# Patient Record
Sex: Male | Born: 1965
Health system: Southern US, Community
[De-identification: ages and names within clinical notes are randomized; demographics above are authoritative.]

## PROBLEM LIST (undated history)

## (undated) DIAGNOSIS — N289 Disorder of kidney and ureter, unspecified: Secondary | ICD-10-CM

## (undated) HISTORY — PX: KIDNEY STONE SURGERY: SHX686

---

## 2009-12-03 ENCOUNTER — Ambulatory Visit: Payer: Self-pay | Admitting: Family Medicine

## 2009-12-03 DIAGNOSIS — R079 Chest pain, unspecified: Secondary | ICD-10-CM

## 2009-12-04 ENCOUNTER — Encounter: Payer: Self-pay | Admitting: Family Medicine

## 2009-12-06 LAB — CONVERTED CEMR LAB
Cholesterol: 187 mg/dL (ref 0–200)
HDL: 57 mg/dL (ref >39)
LDL Cholesterol: 121 mg/dL — ABNORMAL HIGH (ref 0–99)

## 2010-10-07 NOTE — Assessment & Plan Note (Signed)
Summary: PHYSICAL   Vital Signs:  Patient Profile:   45 Years Old Male CC:      Physical Height:     71.5 inches Weight:      205 pounds O2 Sat:      100 % O2 treatment:    Room Air Temp:     98.6 degrees F oral Pulse rate:   60 / minute Resp:     16 per minute BP sitting:   120 / 78  (right arm) Cuff size:   regular  Pt. in pain?   no  Vitals Entered By: Lajean Saver RN (December 03, 2009 2:13 PM)               Vision Screening: Left eye w/o correction: 20 / 20 Right Eye w/o correction: 20 / 15 Both eyes w/o correction:  20/ 13  Color vision testing: normal      Vision Entered By: Lajean Saver RN (December 03, 2009 2:22 PM)    Updated Prior Medication List: No Medications Current Allergies: No known allergies History of Present Illness Chief Complaint: Physical History of Present Illness: Subjective:  Patient presents for physical exam.  However he has specific complaint of occasionally recurring left upper chest pain for about 2 months.  The pain is described as very brief and sharp.  It occurs only sporadically, not associated with any activity, and resolves spontaneousl.  No associated nausea/vomiting, sweating, shortness of breath.  It does not occur with acitivty. He feels well otherwise.  He states that he had lipid testing several years ago.  His HDL and LDL were elevated.  REVIEW OF SYSTEMS Constitutional Symptoms      Denies fever, chills, night sweats, weight loss, weight gain, and fatigue.  Eyes       Denies change in vision, eye pain, eye discharge, glasses, contact lenses, and eye surgery. Ear/Nose/Throat/Mouth       Denies hearing loss/aids, change in hearing, ear pain, ear discharge, dizziness, frequent runny nose, frequent nose bleeds, sinus problems, sore throat, hoarseness, and tooth pain or bleeding.  Respiratory       Denies dry cough, productive cough, wheezing, shortness of breath, asthma, bronchitis, and emphysema/COPD.  Cardiovascular     Complains of chest pain.      Denies murmurs and tires easily with exhertion.    Gastrointestinal       Denies stomach pain, nausea/vomiting, diarrhea, constipation, blood in bowel movements, and indigestion. Genitourniary       Denies painful urination, kidney stones, and loss of urinary control. Neurological       Denies paralysis, seizures, and fainting/blackouts. Musculoskeletal       Denies muscle pain, joint pain, joint stiffness, decreased range of motion, redness, swelling, muscle weakness, and gout.  Skin       Denies bruising, unusual mles/lumps or sores, and hair/skin or nail changes.  Psych       Denies mood changes, temper/anger issues, anxiety/stress, speech problems, depression, and sleep problems. Other Comments: Physical. Patient c/o sharp CP intermittently starting 4 months ago   Past History:  Past Medical History: kidney stones  Past Surgical History: Denies surgical history  Family History: Father- MI, triple bypass CABG, aged 18 Paternal grandfather died of MI aged 14+  Social History: Occupation: truck Hospital doctor Married Never Smoked Alcohol use-yes, weekends Drug use-no Regular exercise-no Smoking Status:  never Drug Use:  no Does Patient Exercise:  no Physical Exam General appearance: well developed, well nourished, no acute distress Head:  normocephalic, atraumatic Eyes: conjunctivae and lids normal Pupils: equal, round, reactive to light Ears: normal, no lesions or deformities Nasal: mucosa pink, nonedematous, no septal deviation, turbinates normal Oral/Pharynx: tongue normal, posterior pharynx without erythema or exudate Neck: neck supple,  trachea midline, no masses Thyroid: no nodules, masses, tenderness, or enlargement Chest/Lungs: no rales, wheezes, or rhonchi bilateral, breath sounds equal without effort Heart: regular rate and  rhythm, no murmur Abdomen: soft, non-tender without obvious organomegaly GU: normal Extremities: normal  extremities Neurological: grossly intact and non-focal Back: no tenderness over musculature, straight leg raises negative bilaterally, deep tendon reflexes 2+ at achilles and patella Skin: no obvious rashes or lesions MSE: oriented to time, place, and person Rectal Exam:  Anus is normal without surrounding erythema.   No external hemorrhoids are present.  No lesions are palpated in the rectal vault.    Prostate is slightly enlarged but symmetric without tenderness or nodules.   EKG:  normal except sinus bradycardia Assessment New Problems: FAMILY HISTORY OF ISCHEMIC HEART DISEASE (ICD-V17.3) CHEST PAIN (ICD-786.50)  NORMAL PHYSICAL EXAM AND EKG.  HAS RISK FACTORS FOR CV DISEASE:  FAMILY HISTORY, PAST HX OF ELEVATED LIPIDS  Plan New Orders: EKG w/ Interpretation [93000] T-Chest x-ray, 2 views [71020] TLB-Lipid Panel [80061-LIPID] New Patient Level V [99205] Planning Comments:   Fasting lipid panel.  Recommend screening PSA, CBC, TSH, UA, and CMP.  Patient declines Appt to cardiologist:  consider stress test.   The patient and/or caregiver has been counseled thoroughly with regard to medications prescribed including dosage, schedule, interactions, rationale for use, and possible side effects and they verbalize understanding.  Diagnoses and expected course of recovery discussed and will return if not improved as expected or if the condition worsens. Patient and/or caregiver verbalized understanding.

## 2012-08-12 DIAGNOSIS — N5089 Other specified disorders of the male genital organs: Secondary | ICD-10-CM | POA: Insufficient documentation

## 2015-07-08 ENCOUNTER — Ambulatory Visit (INDEPENDENT_AMBULATORY_CARE_PROVIDER_SITE_OTHER): Payer: BLUE CROSS/BLUE SHIELD

## 2015-07-08 ENCOUNTER — Encounter: Payer: Self-pay | Admitting: Family Medicine

## 2015-07-08 ENCOUNTER — Ambulatory Visit (INDEPENDENT_AMBULATORY_CARE_PROVIDER_SITE_OTHER): Payer: BLUE CROSS/BLUE SHIELD | Admitting: Family Medicine

## 2015-07-08 VITALS — BP 128/76 | HR 76 | Ht 71.0 in | Wt 211.0 lb

## 2015-07-08 DIAGNOSIS — R1031 Right lower quadrant pain: Secondary | ICD-10-CM

## 2015-07-08 DIAGNOSIS — N5089 Other specified disorders of the male genital organs: Secondary | ICD-10-CM

## 2015-07-08 DIAGNOSIS — N509 Disorder of male genital organs, unspecified: Secondary | ICD-10-CM

## 2015-07-08 DIAGNOSIS — R1030 Lower abdominal pain, unspecified: Secondary | ICD-10-CM | POA: Insufficient documentation

## 2015-07-08 DIAGNOSIS — M25551 Pain in right hip: Secondary | ICD-10-CM

## 2015-07-08 LAB — COMPREHENSIVE METABOLIC PANEL
ALK PHOS: 81 U/L (ref 40–115)
ALT: 26 U/L (ref 9–46)
AST: 20 U/L (ref 10–40)
Albumin: 4.3 g/dL (ref 3.6–5.1)
BILIRUBIN TOTAL: 0.6 mg/dL (ref 0.2–1.2)
BUN: 15 mg/dL (ref 7–25)
CHLORIDE: 104 mmol/L (ref 98–110)
CO2: 27 mmol/L (ref 20–31)
Calcium: 9.4 mg/dL (ref 8.6–10.3)
Creat: 0.84 mg/dL (ref 0.60–1.35)
GLUCOSE: 107 mg/dL — AB (ref 65–99)
POTASSIUM: 4.3 mmol/L (ref 3.5–5.3)
Sodium: 141 mmol/L (ref 135–146)
TOTAL PROTEIN: 6.9 g/dL (ref 6.1–8.1)

## 2015-07-08 LAB — CBC
HCT: 41.4 % (ref 39.0–52.0)
Hemoglobin: 14 g/dL (ref 13.0–17.0)
MCH: 28.6 pg (ref 26.0–34.0)
MCHC: 33.8 g/dL (ref 30.0–36.0)
MCV: 84.7 fL (ref 78.0–100.0)
MPV: 10.3 fL (ref 8.6–12.4)
PLATELETS: 298 10*3/uL (ref 150–400)
RBC: 4.89 MIL/uL (ref 4.22–5.81)
RDW: 13.2 % (ref 11.5–15.5)
WBC: 6.6 10*3/uL (ref 4.0–10.5)

## 2015-07-08 LAB — LIPID PANEL
CHOL/HDL RATIO: 3.4 ratio (ref <=5.0)
CHOLESTEROL: 182 mg/dL (ref 125–200)
HDL: 53 mg/dL (ref >=40)
LDL Cholesterol: 116 mg/dL (ref <130)
Triglycerides: 63 mg/dL (ref <150)
VLDL: 13 mg/dL (ref <30)

## 2015-07-08 NOTE — Assessment & Plan Note (Signed)
Unclear etiology. Will obtain x-ray of pelvis to evaluate for hip OA. Additionally obtain urine GC chlamydia amplification. Additionally obtain CBC metabolic panel and lipid panel for standard fasting labs.

## 2015-07-08 NOTE — Progress Notes (Signed)
Trevor Ball is a 49 y.o. male who [facility]  today for establish care and discuss right groin pain.  Patient has had right-sided groin pain intermittently for years. In 2013 this was worked up and evaluated with scrotal ultrasound showing a small noncancerous appearing cyst on the right testicle associated with small bilateral hydroceles and a images suggestive of a right inguinal hernia.  Several years she has done pretty well with minimal pain. He denies any significant swelling or bulging into his groin. He denies any change with urination penile discharge or pain with ejaculation. He notes normal bowel movements. His pain does not seem to be related to hip positioning. He doesn't truck for living. He's here today because a week ago his symptoms worsened and have become more continuous. He would describe his pain is mild and not very bothersome.   History reviewed. No pertinent past medical history. Past Surgical History  Procedure Laterality Date  . Kidney stone surgery     Social History  Substance Use Topics  . Smoking status: Never Smoker   . Smokeless tobacco: Not on file  . Alcohol Use: 0.0 oz/week    0 Standard drinks or equivalent per week   family history includes Heart disease in his father.  ROS as above: No loss night sweats fevers chills nausea vomiting diarrhea chest pain palpitations shortness of breath. Medications: No current outpatient prescriptions on file.   No current facility-administered medications for this visit.   No Known Allergies   Exam:  BP 128/76 mmHg  Pulse 76  Ht 5\' 11"  (1.803 m)  Wt 211 lb (95.709 kg)  BMI 29.44 kg/m2 Gen: Well NAD HEENT: EOMI,  MMM Lungs: Normal work of breathing. CTABL Heart: RRR no MRG Abd: NABS, Soft. Nondistended, Nontender Exts: Brisk capillary refill, warm and well perfused.  MSK: Hips are nontender bilaterally. Normal flexion and extension. Slightly impaired  internal rotation bilaterally without pain. Pelvis: No inguinal lymphadenopathy. Testicles are descended bilaterally and nontender. Cyst is not palpable per my exam on the right testicle. No palpable inguinal hernias present on dedicated exam. Penis is uncircumcised without discharge.  No results found for this or any previous visit (from the past 24 hour(s)). No results found.   Please see individual assessment and plan sections.

## 2015-07-08 NOTE — Assessment & Plan Note (Signed)
Documented on ultrasound from [facility]. Appears benign. Exam today was also benign.

## 2015-07-08 NOTE — Progress Notes (Signed)
Quick Note:  Xray normal. ______ 

## 2015-07-08 NOTE — Patient Instructions (Signed)
Thank you for coming in today. Return in 1 month for wellness physical and recheck blood pressure.  My nurse will call with results.  If your belly pain worsens, or you have high fever, bad vomiting, blood in your stool or black tarry stool go to the Emergency Room.

## 2015-07-09 ENCOUNTER — Encounter: Payer: Self-pay | Admitting: Family Medicine

## 2015-07-09 DIAGNOSIS — R7301 Impaired fasting glucose: Secondary | ICD-10-CM | POA: Insufficient documentation

## 2015-07-09 LAB — GC PROBE AMPLIFICATION, URINE: GC Probe Amp, Urine: NEGATIVE

## 2015-07-10 NOTE — Progress Notes (Signed)
Quick Note:  Labs show mildly elevated sugar. We will continue to follow. Otherwise labs were normal. ______

## 2015-08-12 ENCOUNTER — Other Ambulatory Visit: Payer: Self-pay | Admitting: Family Medicine

## 2015-08-12 ENCOUNTER — Ambulatory Visit: Payer: BLUE CROSS/BLUE SHIELD

## 2015-08-12 ENCOUNTER — Ambulatory Visit (INDEPENDENT_AMBULATORY_CARE_PROVIDER_SITE_OTHER): Payer: BLUE CROSS/BLUE SHIELD | Admitting: Family Medicine

## 2015-08-12 ENCOUNTER — Encounter: Payer: Self-pay | Admitting: Family Medicine

## 2015-08-12 ENCOUNTER — Ambulatory Visit (INDEPENDENT_AMBULATORY_CARE_PROVIDER_SITE_OTHER): Payer: BLUE CROSS/BLUE SHIELD

## 2015-08-12 VITALS — BP 142/76 | HR 83 | Wt 213.0 lb

## 2015-08-12 DIAGNOSIS — R1031 Right lower quadrant pain: Secondary | ICD-10-CM

## 2015-08-12 DIAGNOSIS — N5089 Other specified disorders of the male genital organs: Secondary | ICD-10-CM

## 2015-08-12 DIAGNOSIS — N509 Disorder of male genital organs, unspecified: Secondary | ICD-10-CM | POA: Diagnosis not present

## 2015-08-12 DIAGNOSIS — N503 Cyst of epididymis: Secondary | ICD-10-CM

## 2015-08-12 DIAGNOSIS — N432 Other hydrocele: Secondary | ICD-10-CM

## 2015-08-12 DIAGNOSIS — N50811 Right testicular pain: Secondary | ICD-10-CM

## 2015-08-12 NOTE — Progress Notes (Signed)
Trevor Ball is a 49 y.o. male who presents to [facility]: [facility]  today for follow-up right groin pain. Patient continues to have intermittent right groin pain off and on. He had a normal pelvis x-ray recently. He has an old ultrasound of the scrotum that shows an epidermoid cyst as well as a small right-sided inguinal hernia. He notes symptoms continue and are quite bothersome at times. He denies any scrotum mass that's new or trouble urinating or having a bowel movement.   No past medical history on file. Past Surgical History  Procedure Laterality Date  . Kidney stone surgery     Social History  Substance Use Topics  . Smoking status: Never Smoker   . Smokeless tobacco: Not on file  . Alcohol Use: 0.0 oz/week    0 Standard drinks or equivalent per week   family history includes Heart disease in his father.  ROS as above Medications: No current outpatient prescriptions on file.   No current facility-administered medications for this visit.   No Known Allergies   Exam:  BP 142/76 mmHg  Pulse 83  Wt 213 lb (96.616 kg) Gen: Well NAD   No results found for this or any previous visit (from the past 24 hour(s)). No results found.   Please see individual assessment and plan sections.

## 2015-08-12 NOTE — Patient Instructions (Signed)
Thank you for coming in today. Go to radiology to schedule the ultrasound today.  We will call with results.

## 2015-08-12 NOTE — Assessment & Plan Note (Signed)
Scrotum ultrasound ordered. If hernia present refer to general surgery

## 2015-08-13 NOTE — Progress Notes (Signed)
Quick Note:  No hernia is present. There are some changes in the scrotum that may be causing the pain. I will refer you to urology for their opinion about the cause of the pain and for any possible solution. ______

## 2016-06-29 ENCOUNTER — Encounter: Payer: Self-pay | Admitting: Family Medicine

## 2016-06-29 ENCOUNTER — Ambulatory Visit (INDEPENDENT_AMBULATORY_CARE_PROVIDER_SITE_OTHER): Payer: BLUE CROSS/BLUE SHIELD | Admitting: Family Medicine

## 2016-06-29 VITALS — BP 127/82 | HR 82 | Wt 212.0 lb

## 2016-06-29 DIAGNOSIS — L219 Seborrheic dermatitis, unspecified: Secondary | ICD-10-CM | POA: Insufficient documentation

## 2016-06-29 DIAGNOSIS — Z23 Encounter for immunization: Secondary | ICD-10-CM | POA: Diagnosis not present

## 2016-06-29 DIAGNOSIS — R7301 Impaired fasting glucose: Secondary | ICD-10-CM | POA: Diagnosis not present

## 2016-06-29 DIAGNOSIS — L989 Disorder of the skin and subcutaneous tissue, unspecified: Secondary | ICD-10-CM

## 2016-06-29 DIAGNOSIS — Z1211 Encounter for screening for malignant neoplasm of colon: Secondary | ICD-10-CM

## 2016-06-29 MED ORDER — KETOCONAZOLE 2 % EX CREA: 1.0000 "application " | TOPICAL_CREAM | Freq: Two times a day (BID) | CUTANEOUS | 3 refills | Status: DC

## 2016-06-29 NOTE — Patient Instructions (Signed)
Thank you for coming in today. Get fasting labs soon.  Use ketocaonazole cream as needed.   Seborrheic Dermatitis Seborrheic dermatitis involves pink or red skin with greasy, flaky scales. It usually occurs on the scalp, and it is often called dandruff. This condition may also affect the eyebrows, nose, ears, chest, and the bearded area of men's faces. It often occurs where skin has more oil (sebaceous) glands. It may come and go for no known reason, and it is often long-lasting (chronic). CAUSES The cause is not known. RISK FACTORS This condition is more like to develop in:  People who are stressed or tired.  People who have skin conditions, such as acne.  People who have certain conditions, such as:  HIV (human immunodeficiency virus).  AIDS (acquired immunodeficiency syndrome).  Parkinson disease.  An eating disorder.  Stroke.  Depression.  Epilepsy.  Alcoholism.  People who live in places that have extreme weather.  People who have a family history of seborrheic dermatitis.  People who use skin creams that are made with alcohol.  People who are 7830-50 years old.  People who take certain medicines. SYMPTOMS Symptoms of this condition include:  Thick scales on the scalp.  Redness on the face or in the armpits.  Skin that is flaky. The flakes may be white or yellow.  Skin that seems oily or dry but is not helped with moisturizers.  Itching or burning in the affected areas. DIAGNOSIS This condition is diagnosed with a medical history and physical exam. A sample of your skin may be tested (skin biopsy). You may need to see a skin specialist (dermatologist). TREATMENT There is no cure for this condition, but treatment can help to manage the symptoms. Treatment may include:  Cortisone (steroid) ointments, creams, and lotions.  Over-the-counter or prescription shampoos. HOME CARE INSTRUCTIONS  Apply over-the-counter and prescription medicines only as told by  your health care provider.  Keep all follow-up visits as told by your health care provider. This is important.  Try to reduce your stress, such as with yoga or mediation. If you need help to reduce stress, ask your health care provider.  Shower or bathe as told by your health care provider.  Use any medicated shampoos as told by your health care provider. SEEK MEDICAL CARE IF:  Your symptoms do not improve with treatment.  Your symptoms get worse.  You have new symptoms.   This information is not intended to replace advice given to you by your health care provider. Make sure you discuss any questions you have with your health care provider.   Document Released: 08/24/2005 Document Revised: 05/15/2015 Document Reviewed: 01/09/2015 Elsevier Interactive Patient Education [employer]2016 Elsevier Inc.

## 2016-06-29 NOTE — Progress Notes (Signed)
       Trevor GrahamGeorge Ball is a 50 y.o. male who [facility] today for skin lesion on back, seborrheic dermatitis, follow-up impaired fasting glucose, colon cancer screening.  Skin lesion on back: Patient has a history of multiple nevi on his trunk. He notes one on the right lower back has been changing over the past 2 months. It has become more prominent and has developed a small papule. He denies any fevers or chills weight loss or night sweats.  Additionally patient has a history of seborrheic dermatitis in his eyebrows. In the past he's been prescribed a cream and would like a refill of some cream for his face rash.  Impaired fasting glucose noted on previous fasting labs. He would like this issue followed up today as well.  Health maintenance: Patient declines influenza vaccine but will receive Tdap today. Additionally he would like Cologuard for colon cancer screening.   No past medical history on file. Past Surgical History:  Procedure Laterality Date  . KIDNEY STONE SURGERY     Social History  Substance Use Topics  . Smoking status: Never Smoker  . Smokeless tobacco: Not on file  . Alcohol use 0.0 oz/week   family history includes Heart disease in his father.  ROS as above:  Medications: Current Outpatient Prescriptions  Medication Sig Dispense Refill  . ketoconazole (NIZORAL) 2 % cream Apply 1 application topically 2 (two) times daily. To affected areas. 60 g 3   No current facility-administered medications for this visit.    No Known Allergies  Health Maintenance Health Maintenance  Topic Date Due  . TETANUS/TDAP  02/24/1985  . COLONOSCOPY  02/25/2016  . INFLUENZA VACCINE  05/08/2029 (Originally 04/07/2016)  . HIV Screening  Addressed     Exam:  BP 127/82   Pulse 82   Wt 212 lb (96.2 kg)   BMI 29.57 kg/m  Gen: Well NAD HEENT: EOMI,   MMM Lungs: Normal work of breathing. CTABL Heart: RRR no MRG Abd: NABS, Soft. Nondistended, Nontender Exts: Brisk capillary refill, warm and well perfused.  Skin: Back multiple flat nevi none larger than 1 cm. Right low back nevus is 0.7cm oval circular with a  Small papule. Non-tender.  Face: Erythematous scaly rash around eyebrows.  Shave biopsy: Consent obtained and timeout performed. Skin cleaned with alcohol and cold spray applied and 1 mL of lidocaine injected achieving good anesthesia. Skin again cleaned with alcohol and shave biopsy obtained. Dressing applied. Patient tolerated the procedure well.    No results found for this or any previous visit (from the past 72 hour(s)). No results found.    Assessment and Plan: 50 y.o. male with   Nevus on back somewhat dysplastic appearing. Shave biopsy obtained and pending.  Seborrheic dermatitis treated with ketoconazole cream.  Hyperglycemia: Labs below pending.  Health maintenance: Cologuard and Tdap ordered. Influenza vaccine declined.   Orders Placed This Encounter  Procedures  . Tdap vaccine greater than or equal to 7yo IM  . CBC  . COMPLETE METABOLIC PANEL WITH GFR  . Hemoglobin A1c  . Lipid panel  . TSH  . VITAMIN D 25 Hydroxy (Vit-D Deficiency, Fractures)    Discussed warning signs or symptoms. Please see discharge instructions. Patient expresses understanding.

## 2017-06-28 ENCOUNTER — Encounter: Payer: Self-pay | Admitting: Family Medicine

## 2017-06-28 ENCOUNTER — Ambulatory Visit (INDEPENDENT_AMBULATORY_CARE_PROVIDER_SITE_OTHER): Payer: BLUE CROSS/BLUE SHIELD

## 2017-06-28 ENCOUNTER — Ambulatory Visit (INDEPENDENT_AMBULATORY_CARE_PROVIDER_SITE_OTHER): Payer: BLUE CROSS/BLUE SHIELD | Admitting: Family Medicine

## 2017-06-28 VITALS — BP 133/77 | HR 69 | Ht 71.0 in | Wt 218.0 lb

## 2017-06-28 DIAGNOSIS — L219 Seborrheic dermatitis, unspecified: Secondary | ICD-10-CM | POA: Diagnosis not present

## 2017-06-28 DIAGNOSIS — R7301 Impaired fasting glucose: Secondary | ICD-10-CM

## 2017-06-28 DIAGNOSIS — Z0000 Encounter for general adult medical examination without abnormal findings: Secondary | ICD-10-CM

## 2017-06-28 DIAGNOSIS — H01002 Unspecified blepharitis right lower eyelid: Secondary | ICD-10-CM | POA: Diagnosis not present

## 2017-06-28 DIAGNOSIS — R05 Cough: Secondary | ICD-10-CM

## 2017-06-28 DIAGNOSIS — R079 Chest pain, unspecified: Secondary | ICD-10-CM

## 2017-06-28 DIAGNOSIS — Z1211 Encounter for screening for malignant neoplasm of colon: Secondary | ICD-10-CM

## 2017-06-28 MED ORDER — POLYMYXIN B-TRIMETHOPRIM 10000-0.1 UNIT/ML-% OP SOLN: 2.0000 [drp] | OPHTHALMIC | 0 refills | Status: DC

## 2017-06-28 NOTE — Progress Notes (Signed)
Trevor Ball is a 51 y.o. male who presents to [name] today for well adult visit.   Trevor Ball has two issues today he would like to discuss.  Chest Pain: Trevor Ball notes occasional left-sided chest pain. This typically do not occur with exertion. He denies significant palpitations or shortness of breath. He feels well otherwise. He notes a positive family history for early cardiac disease in both his father and grandfather.  Additionally he has some irritation of the right lower eyelid. This was bad about a week ago and is improving now with no treatment. He denies any blurry vision or pain. No fevers or chills. No significant discharge.  Overall he thinks his health is pretty good. He does not smoke or drink excessively. He gets moderate exercise and try Eat a careful diet.   No past medical history on file. Past Surgical History:  Procedure Laterality Date  . KIDNEY STONE SURGERY     Social History  Substance Use Topics  . Smoking status: Never Smoker  . Smokeless tobacco: Never Used  . Alcohol use 0.0 oz/week   family history includes Heart disease in his father.  ROS as above:  Medications: Current Outpatient Prescriptions  Medication Sig Dispense Refill  . ketoconazole (NIZORAL) 2 % cream Apply 1 application topically 2 (two) times daily. To affected areas. (Patient not taking: Reported on 06/28/2017) 60 g 3  . trimethoprim-polymyxin b (POLYTRIM) ophthalmic solution Place 2 drops into the right eye every 4 (four) hours. 10 mL 0   No current facility-administered medications for this visit.    No Known Allergies  Health Maintenance Health Maintenance  Topic Date Due  . COLONOSCOPY  02/25/2016  . INFLUENZA VACCINE  06/28/2018 (Originally 04/07/2017)  . TETANUS/TDAP  06/29/2026  . HIV Screening  Completed     Exam:  BP 133/77   Pulse 69   Ht 5'  11" (1.803 m)   Wt 218 lb (98.9 kg)   BMI 30.40 kg/m  Gen: Well NAD HEENT: EOMI,  MMM Mild injection and erythema of the right inferior lateral eyelid. Mild conjunctival injection. Lungs: Normal work of breathing. CTABL Heart: RRR no MRG Abd: NABS, Soft. Nondistended, Nontender Exts: Brisk capillary refill, warm and well perfused.    12 lead EKG: NSR at 71 bpm. No qwaves, no ST elevation or depression. Normal EKG.   No results found for this or any previous visit (from the past 72 hour(s)). Dg Chest 2 View  Result Date: 06/28/2017 CLINICAL DATA:  Left chest pain and cough for 1 month. EXAM: CHEST  2 VIEW COMPARISON:  PA and lateral chest 12/03/2009. FINDINGS: The lungs are clear. Heart size is normal. No pneumothorax or pleural effusion. No acute bony abnormality. IMPRESSION: No acute disease. Electronically Signed   By: Drusilla Kanner M.D.   On: 06/28/2017 09:45      Assessment and Plan: 51 y.o. male with \ Well adult:  Doing reasonably well Overall. Plan to check basic fasting labs listed below.    Chest pain: Likely noncardiac as EKG and chest x-ray normal. Regardless his risk is somewhat elevated based on his family history. Plan to obtain an stress test and lipid panel to risk factor categorize him better.  Blepharitis right lower eyelid: Doing well. Plan for warm compress as well as Polytrim antibiotic drops  Orders Placed This Encounter  Procedures  . DG Chest 2 View    Order Specific Question:  Reason for exam:    Answer:   Cough, assess intra-thoracic pathology    Order Specific Question:   Preferred imaging location?    Answer[facility]   [facility]  . CBC  . COMPLETE METABOLIC PANEL WITH GFR  . Lipid Panel w/reflex Direct LDL  . Hemoglobin A1c  . Ambulatory referral to Gastroenterology    Referral Priority:   Routine    Referral Type:   Consultation    Referral Reason:   Specialty Services Required    Number of Visits Requested:   1  . Exercise  Tolerance Test    Standing Status:   Future    Standing Expiration Date:   09/28/2018    Order Specific Question:   Where should this test be performed    Answer:   [facility] [facility]    Order Specific Question:   Stress with Dobutamine or Treadmill with exercise?    Answer:   Treadmill w/ exercise    Order Specific Question:   Is patient able to ambulate on a treadmill?    Answer:   Yes  . EKG 12-Lead   Meds ordered this encounter  Medications  . trimethoprim-polymyxin b (POLYTRIM) ophthalmic solution    Sig: Place 2 drops into the right eye every 4 (four) hours.    Dispense:  10 mL    Refill:  0     Discussed warning signs or symptoms. Please see discharge instructions. Patient expresses understanding.

## 2017-06-28 NOTE — Patient Instructions (Signed)
Thank you for coming in today. Continue warm compress for right eye.  You should hear about Treadmill Stress Test.  Let me know if you do not hear anything.  Also you should hear from [facility] about coloscopy.   Get labs today.   Please get an xray of your lungs today.    Recheck yearly or sooner if needed   Blepharitis Blepharitis means swollen eyelids. Follow these instructions at home: Pay attention to any changes in how you look or feel. Follow these instructions to help with your condition: Keeping Clean  Wash your hands often.  Wash your eyelids with warm water, or wash them with warm water that is mixed with little bit of baby shampoo. Do this 2 or more times per day.  Wash your face and eyebrows at least once a day.  Use a clean towel each time you dry your eyelids. Do not use the towel to clean or dry other areas of your body. Do not share your towel with anyone. General instructions  Avoid wearing makeup until you get better. Do not share makeup with anyone.  Avoid rubbing your eyes.  Put a warm compress on your eyes 2 times per day for 10 minutes at a time or as told by your doctor.  If you were told to use an medicated cream or eye drops, use the medicine as told by your doctor. Do not stop using the medicine even if you feel better.  Keep all follow-up visits as told by your doctor. This is important. Contact a doctor if:  Your eyelids feel hot.  You have blisters on your eyelids.  You have a rash on your eyelids.  The swelling does not go away in 2-4 days.  The swelling gets worse. Get help right away if:  You have pain that gets worse.  You have pain that spreads to other parts of your face.  You have redness that gets worse.  You have redness that spreads to other parts of your face.  Your vision changes.  You have pain when you look at lights or things that move.  You have a fever. This information is not intended to replace  advice given to you by your health care provider. Make sure you discuss any questions you have with your health care provider. Document Released: 06/02/2008 Document Revised: 01/30/2016 Document Reviewed: 12/17/2014 Elsevier Interactive Patient Education  [employer]2018 Elsevier Inc. .

## 2017-06-29 LAB — CBC
HEMATOCRIT: 43.2 % (ref 38.5–50.0)
Hemoglobin: 14.5 g/dL (ref 13.2–17.1)
MCH: 27.4 pg (ref 27.0–33.0)
MCHC: 33.6 g/dL (ref 32.0–36.0)
MCV: 81.5 fL (ref 80.0–100.0)
MPV: 10.7 fL (ref 7.5–12.5)
PLATELETS: 266 10*3/uL (ref 140–400)
RBC: 5.3 10*6/uL (ref 4.20–5.80)
RDW: 12.9 % (ref 11.0–15.0)
WBC: 6.3 10*3/uL (ref 3.8–10.8)

## 2017-06-29 LAB — COMPLETE METABOLIC PANEL WITH GFR
AG RATIO: 1.6 (calc) (ref 1.0–2.5)
ALKALINE PHOSPHATASE (APISO): 94 U/L (ref 40–115)
ALT: 23 U/L (ref 9–46)
AST: 22 U/L (ref 10–35)
Albumin: 4.4 g/dL (ref 3.6–5.1)
BILIRUBIN TOTAL: 0.6 mg/dL (ref 0.2–1.2)
BUN: 11 mg/dL (ref 7–25)
CHLORIDE: 103 mmol/L (ref 98–110)
CO2: 31 mmol/L (ref 20–32)
Calcium: 9.6 mg/dL (ref 8.6–10.3)
Creat: 0.92 mg/dL (ref 0.70–1.33)
GFR, Est African American: 111 mL/min/{1.73_m2} (ref >=60)
GFR, Est Non African American: 96 mL/min/{1.73_m2} (ref >=60)
Globulin: 2.7 g/dL (calc) (ref 1.9–3.7)
Glucose, Bld: 99 mg/dL (ref 65–99)
POTASSIUM: 4.7 mmol/L (ref 3.5–5.3)
SODIUM: 139 mmol/L (ref 135–146)
Total Protein: 7.1 g/dL (ref 6.1–8.1)

## 2017-06-29 LAB — LIPID PANEL W/REFLEX DIRECT LDL
CHOLESTEROL: 164 mg/dL (ref <200)
HDL: 49 mg/dL (ref >40)
LDL Cholesterol (Calc): 100 mg/dL (calc) — ABNORMAL HIGH
NON-HDL CHOLESTEROL (CALC): 115 mg/dL (ref <130)
Total CHOL/HDL Ratio: 3.3 (calc) (ref <5.0)
Triglycerides: 63 mg/dL (ref <150)

## 2017-06-29 LAB — HEMOGLOBIN A1C
HEMOGLOBIN A1C: 5 %{Hb} (ref <5.7)
MEAN PLASMA GLUCOSE: 97 (calc)
eAG (mmol/L): 5.4 (calc)

## 2017-07-07 ENCOUNTER — Ambulatory Visit (HOSPITAL_COMMUNITY): Payer: BLUE CROSS/BLUE SHIELD

## 2017-07-19 DIAGNOSIS — D123 Benign neoplasm of transverse colon: Secondary | ICD-10-CM | POA: Diagnosis not present

## 2017-07-19 DIAGNOSIS — Z1211 Encounter for screening for malignant neoplasm of colon: Secondary | ICD-10-CM | POA: Diagnosis not present

## 2017-07-19 LAB — HM COLONOSCOPY

## 2017-08-23 ENCOUNTER — Encounter: Payer: Self-pay | Admitting: *Deleted

## 2019-05-18 IMAGING — DX DG CHEST 2V
2 series · 2 of 2 positions shown · non-contrast
Comparison: PA and lateral chest 12/03/2009.

CLINICAL DATA: Left chest pain and cough for 1 month.

EXAM:
CHEST  2 VIEW

[chest pa]
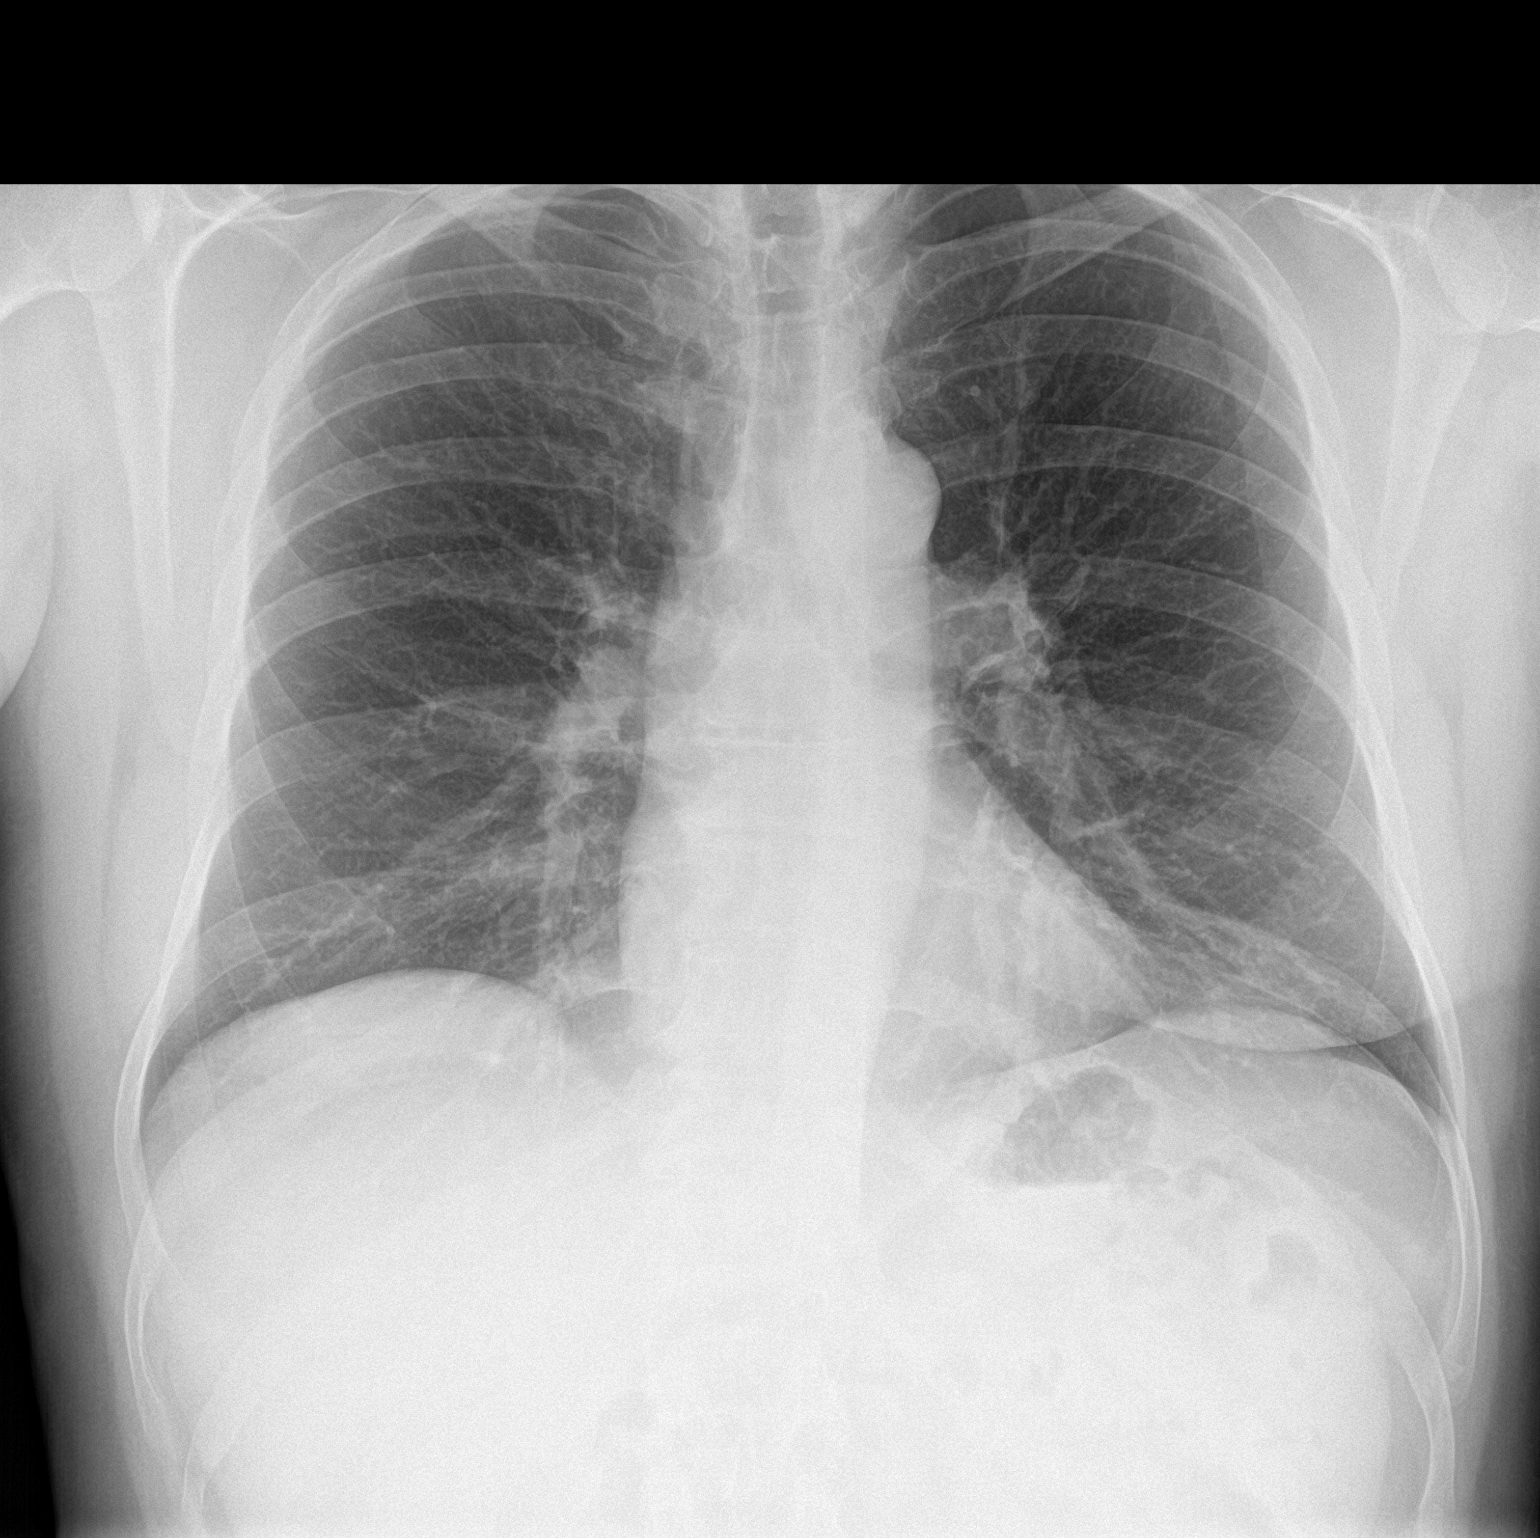

[chest lat]
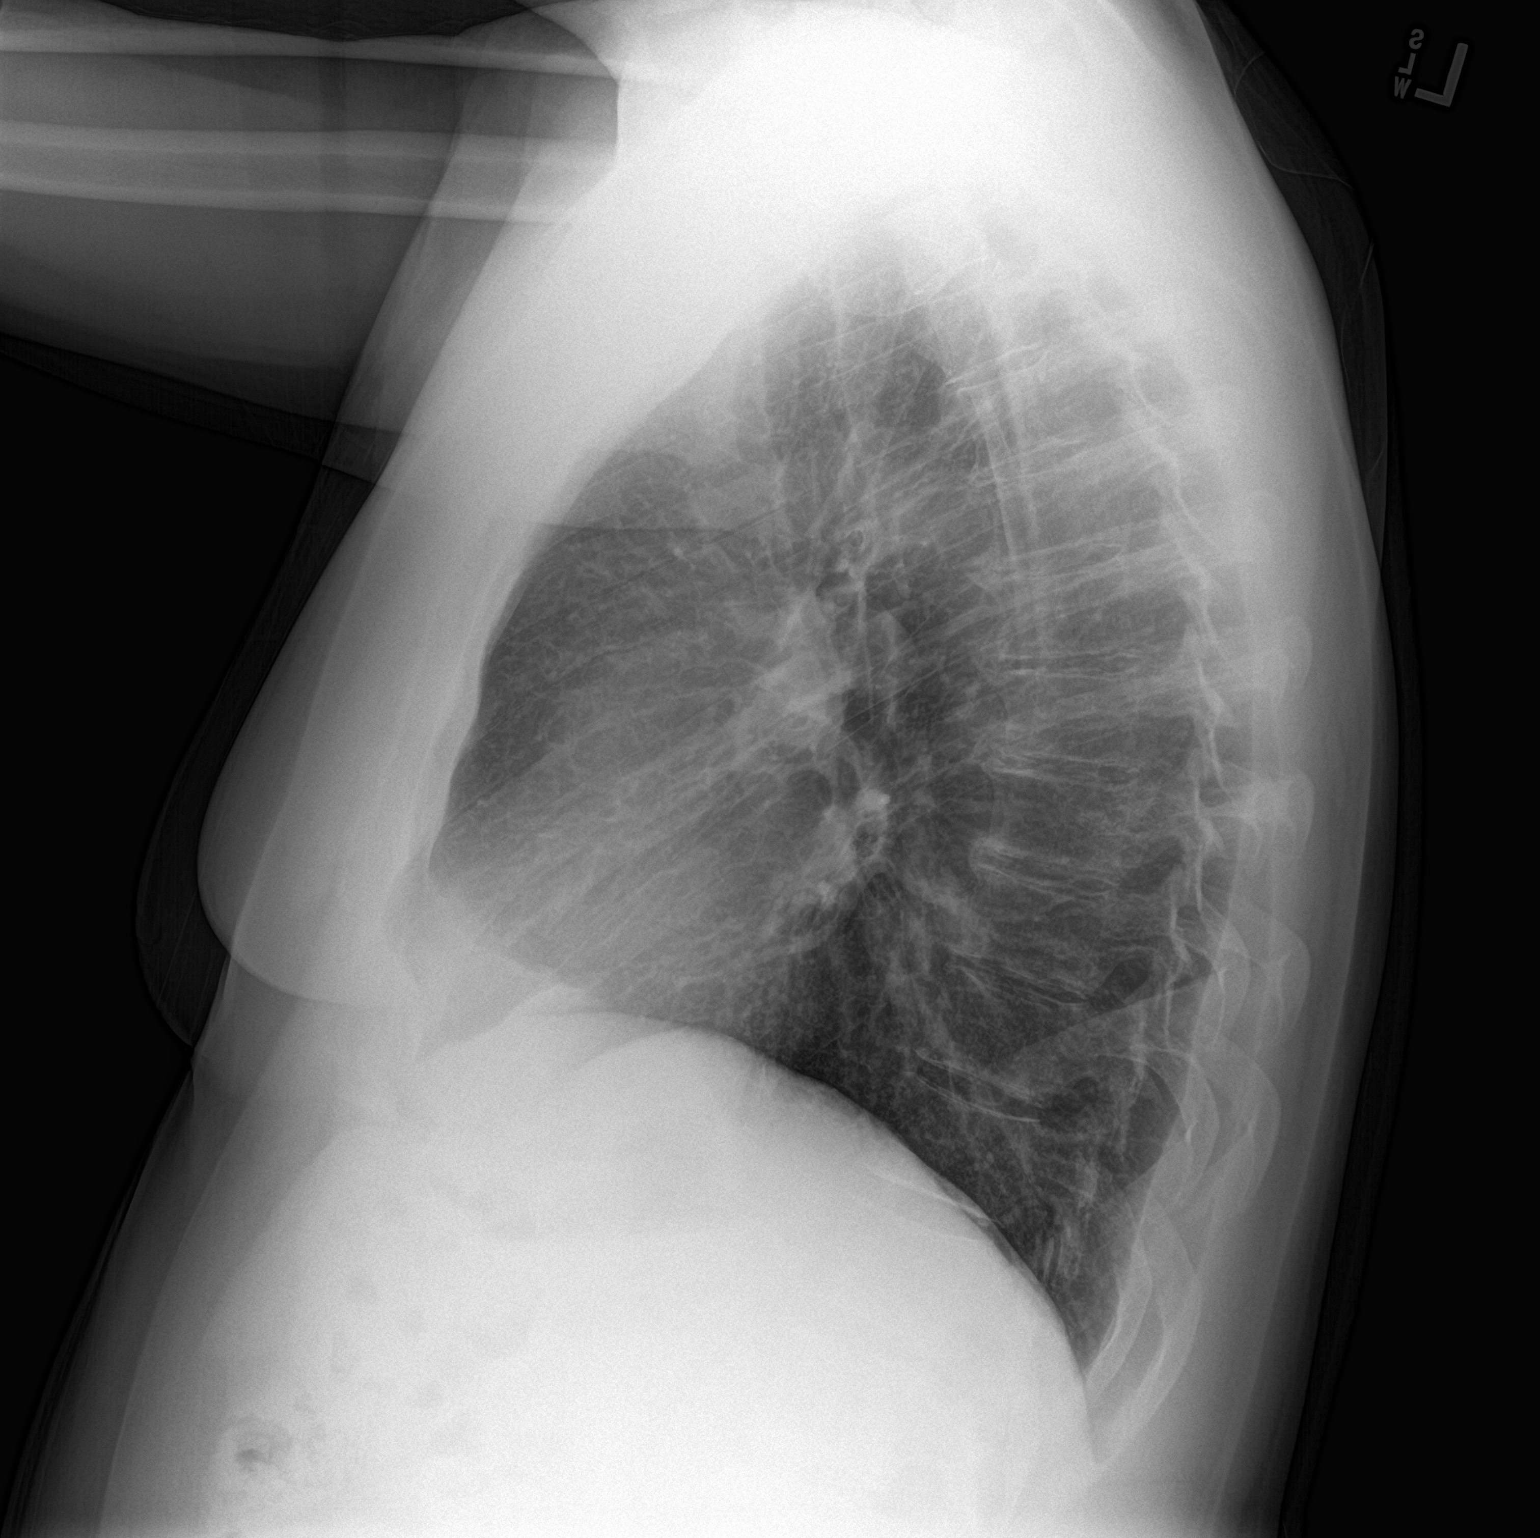

[2 of 2 positions shown; findings below may reference images not displayed]

FINDINGS: The lungs are clear. Heart size is normal. No pneumothorax or
pleural effusion. No acute bony abnormality.
IMPRESSION: No acute disease.

## 2019-09-12 DIAGNOSIS — Z20822 Contact with and (suspected) exposure to covid-19: Secondary | ICD-10-CM | POA: Diagnosis not present

## 2019-11-27 ENCOUNTER — Ambulatory Visit (INDEPENDENT_AMBULATORY_CARE_PROVIDER_SITE_OTHER): Payer: BC Managed Care – PPO | Admitting: Family Medicine

## 2019-11-27 ENCOUNTER — Other Ambulatory Visit: Payer: Self-pay

## 2019-11-27 ENCOUNTER — Encounter: Payer: Self-pay | Admitting: Family Medicine

## 2019-11-27 DIAGNOSIS — R030 Elevated blood-pressure reading, without diagnosis of hypertension: Secondary | ICD-10-CM | POA: Insufficient documentation

## 2019-11-27 DIAGNOSIS — M25512 Pain in left shoulder: Secondary | ICD-10-CM | POA: Insufficient documentation

## 2019-11-27 DIAGNOSIS — G8929 Other chronic pain: Secondary | ICD-10-CM | POA: Diagnosis not present

## 2019-11-27 MED ORDER — METHYLPREDNISOLONE ACETATE 40 MG/ML IJ SUSP
40.0000 mg | Freq: Once | INTRAMUSCULAR | Status: AC
  Administered 2019-11-27: 40 mg via INTRA_ARTICULAR

## 2019-11-27 NOTE — Progress Notes (Signed)
Trevor Ball - 54 y.o. male MRN [id number]  Date of birth: 11/27/1965  Subjective Chief Complaint  Patient presents with  . Shoulder Pain    HPI Trevor Ball is a 54 y.o. male here today with complaint of L shoulder pain.  He reports that symptoms started several weeks ago.  He denies any known injury or overuse.  He works as a Naval architect.  His symptoms were mild at initial onset but has progressively worsened.  He has had gradually worsening ROM of the shoulder as well with difficulty with abduction, internal and external rotation.  He denies feeling of weakness, numbness, tingling into the arm and does not have any associated neck pain.  He has not tried anything so far to help with this.   ROS:  A comprehensive ROS was completed and negative except as noted per HPI  No Known Allergies  No past medical history on file.  Past Surgical History:  Procedure Laterality Date  . KIDNEY STONE SURGERY      Social History   Socioeconomic History  . Marital status: Married    Spouse name: Not on file  . Number of children: Not on file  . Years of education: Not on file  . Highest education level: Not on file  Occupational History  . Not on file  Tobacco Use  . Smoking status: Never Smoker  . Smokeless tobacco: Never Used  Substance and Sexual Activity  . Alcohol use: Yes    Alcohol/week: 0.0 standard drinks  . Drug use: No  . Sexual activity: Yes    Partners: Female  Other Topics Concern  . Not on file  Social History Narrative  . Not on file   Social Determinants of Health   Financial Resource Strain:   . Difficulty of Paying Living Expenses:   Food Insecurity:   . Worried About Programme researcher, broadcasting/film/video in the Last Year:   . Barista in the Last Year:   Transportation Needs:   . Freight forwarder (Medical):   Marland Kitchen Lack of Transportation (Non-Medical):   Physical Activity:   . Days of Exercise per Week:   . Minutes of Exercise per Session:   Stress:   .  Feeling of Stress :   Social Connections:   . Frequency of Communication with Friends and Family:   . Frequency of Social Gatherings with Friends and Family:   . Attends Religious Services:   . Active Member of Clubs or Organizations:   . Attends Banker Meetings:   Marland Kitchen Marital Status:     Family History  Problem Relation Age of Onset  . Heart disease Father     Health Maintenance  Topic Date Due  . INFLUENZA VACCINE  Never done  . TETANUS/TDAP  06/29/2026  . COLONOSCOPY  07/20/2027  . HIV Screening  Completed     ----------------------------------------------------------------------------------------------------------------------------------------------------------------------------------------------------------------- Physical Exam BP (!) 160/89   Pulse 98   Temp 98.3 F (36.8 C) (Oral)   Ht 5\' 11"  (1.803 m)   Wt 203 lb (92.1 kg)   BMI 28.31 kg/m   Physical Exam Constitutional:      Appearance: Normal appearance.  Cardiovascular:     Rate and Rhythm: Normal rate and regular rhythm.  Pulmonary:     Effort: Pulmonary effort is normal.     Breath sounds: Normal breath sounds.  Musculoskeletal:     Comments: L shoulder normal to inspection and palpation. Bicipital groove is non-tender. AROM of L  shoulder is limited in abduction, internal and external rotation.  PROM of is limited as well.    Strength of rotator cuff muscles is normal.  Negative drop arm test.  Positive impingement testing with pain on hawkins test. Empty can negative.   Skin:    General: Skin is warm and dry.  Neurological:     General: No focal deficit present.     Mental Status: He is alert.   Procedure note:  Procedure reviewed with patient. Reviewed potential complications and all questions answered.  Verbal consent given and time out completed.  Patient was prepped in typical sterile fashion using chlorhexidine.  A cold spray was applied to anesthetize the skin and the L subacromial  space was injected with 94ml 40mg /mL depo-medrol and 24mL 1% lidocaine.  He tolerated procedure well and had immediate improvement in pain.  ROM was still somewhat limited though. Post procedure instructions given.      ------------------------------------------------------------------------------------------------------------------------------------------------------------------------------------------------------------------- Assessment and Plan  Left shoulder pain Subacromial pathology such as bursitis vs frozen shoulder.  He has good strength in testing rotator cuff muscles so I think tear is less likely.   Discussed subacromial injection today as both a treatment and diagnostic procedure.  Injection completed and he had immediate relief and some improvement in ROM.  He will wait a few days and see if this continues to improve.  If ROM is not improving significantly will refer to Dr. Dianah Field to discuss US guided intra-articular injection and PT.    Elevated BP without diagnosis of hypertension BP noted to be elevated today initially. Recheck with improvement.   Meds ordered this encounter  Medications  . methylPREDNISolone acetate (DEPO-MEDROL) injection 40 mg    No follow-ups on file.    This visit occurred during the SARS-CoV-2 public health emergency.  Safety protocols were in place, including screening questions prior to the visit, additional usage of staff PPE, and extensive cleaning of exam room while observing appropriate contact time as indicated for disinfecting solutions.

## 2019-11-27 NOTE — Assessment & Plan Note (Signed)
BP noted to be elevated today initially. Recheck with improvement.

## 2019-11-27 NOTE — Patient Instructions (Addendum)

## 2019-11-27 NOTE — Assessment & Plan Note (Addendum)
Subacromial pathology such as bursitis vs frozen shoulder.  He has good strength in testing rotator cuff muscles so I think tear is less likely.   Discussed subacromial injection today as both a treatment and diagnostic procedure.  Injection completed and he had immediate relief and some improvement in ROM.  He will wait a few days and see if this continues to improve.  If ROM is not improving significantly will refer to Dr. Benjamin Stain to discuss US guided intra-articular injection and PT.

## 2019-11-27 NOTE — Progress Notes (Signed)
NDC on bottle: 7915-0413-64 BIP:[id number] EXP:07-21

## 2020-02-11 ENCOUNTER — Emergency Department
Admission: EM | Admit: 2020-02-11 | Discharge: 2020-02-11 | Disposition: A | Discharge status: Home / Self Care | Payer: BC Managed Care – PPO | Source: Home / Self Care | Attending: Family Medicine | Admitting: Family Medicine

## 2020-02-11 ENCOUNTER — Encounter: Payer: Self-pay | Admitting: Emergency Medicine

## 2020-02-11 ENCOUNTER — Other Ambulatory Visit: Payer: Self-pay

## 2020-02-11 DIAGNOSIS — L255 Unspecified contact dermatitis due to plants, except food: Secondary | ICD-10-CM | POA: Diagnosis not present

## 2020-02-11 DIAGNOSIS — L03116 Cellulitis of left lower limb: Secondary | ICD-10-CM

## 2020-02-11 DIAGNOSIS — L03115 Cellulitis of right lower limb: Secondary | ICD-10-CM | POA: Diagnosis not present

## 2020-02-11 HISTORY — DX: Disorder of kidney and ureter, unspecified: N28.9

## 2020-02-11 LAB — POCT CBC W AUTO DIFF (K'VILLE URGENT CARE)

## 2020-02-11 MED ORDER — PREDNISONE 20 MG PO TABS: ORAL_TABLET | ORAL | 0 refills | Status: DC

## 2020-02-11 MED ORDER — DOXYCYCLINE HYCLATE 100 MG PO CAPS: 100.0000 mg | ORAL_CAPSULE | Freq: Two times a day (BID) | ORAL | 0 refills | Status: DC

## 2020-02-11 NOTE — ED Triage Notes (Signed)
Patient developed rash over a week ago; started on feet and ankles with blisters now in evidence; rash has ascended to upper legs and thighs; has been using OTCs without relief. Has not had recent exposure to person with covid; wife had it in 2020; has not been vaccinated against covid.

## 2020-02-11 NOTE — ED Provider Notes (Signed)
[facility]    CSN: [id number] Arrival date & time: 02/11/20  0901      History   Chief Complaint Chief Complaint  Patient presents with   Rash    HPI Trevor Ball is a 54 y.o. male.   Patient contacted poison ivy about 10 days ago, resulting in a pruritic isolated lesion on his left lower leg.  He subsequently developed a spreading erythematous rash on both lower legs, now ascending to his thighs.  During the past two days he has developed multiple blisters on his lower legs, and the original rash on his left leg has a very large blister present.  He has had no improvement with topical preparations.  He denies fevers, chills, and sweats.  The history is provided by the patient.  Poison [place] This is a new problem. The current episode started more than 1 week ago. The problem occurs constantly. The problem has been gradually worsening. Pertinent negatives include no chest pain and no headaches. Nothing aggravates the symptoms. Nothing relieves the symptoms. Treatments tried: topical creams. The treatment provided no relief.    Past Medical History:  Diagnosis Date   Renal disorder    calculi    Patient Active Problem List   Diagnosis Date Noted   Left shoulder pain 11/27/2019   Elevated BP without diagnosis of hypertension 11/27/2019   Seborrheic dermatitis 06/29/2016   IFG (impaired fasting glucose) 07/09/2015   Groin pain 07/08/2015   Testicular mass 08/12/2012    Past Surgical History:  Procedure Laterality Date   KIDNEY STONE SURGERY         Home Medications    Prior to Admission medications   Medication Sig Start Date End Date Taking? Authorizing Provider  doxycycline (VIBRAMYCIN) 100 MG capsule Take 1 capsule (100 mg total) by mouth 2 (two) times daily. Take with food. 02/11/20   Lattie Haw, MD  predniSONE (DELTASONE) 20 MG tablet Take one tab by mouth twice daily for 4 days, then one daily for 3 days. Take with food. 02/11/20    Lattie Haw, MD    Family History Family History  Problem Relation Age of Onset   Heart disease Father     Social History Social History   Tobacco Use   Smoking status: Never Smoker   Smokeless tobacco: Never Used  Substance Use Topics   Alcohol use: Yes    Alcohol/week: 0.0 standard drinks   Drug use: No     Allergies   Patient has no known allergies.   Review of Systems Review of Systems  Constitutional: Negative for activity change, appetite change, chills, diaphoresis, fatigue and fever.  Respiratory: Negative for chest tightness.   Cardiovascular: Negative for chest pain and palpitations.  Skin: Positive for color change and rash.  Neurological: Negative for headaches.  All other systems reviewed and are negative.    Physical Exam Triage Vital Signs ED Triage Vitals  Enc Vitals Group     BP 02/11/20 0919 (!) 145/94     Pulse Rate 02/11/20 0919 (!) 110     Resp 02/11/20 0919 18     Temp 02/11/20 0919 99.3 F (37.4 C)     Temp Source 02/11/20 0919 Oral     SpO2 02/11/20 0919 98 %     Weight 02/11/20 0921 205 lb (93 kg)     Height 02/11/20 0921 5\' 11"  (1.803 m)     Head Circumference --      Peak Flow --  Pain Score 02/11/20 0920 0     Pain Loc --      Pain Edu? --      Excl. in GC? --    No data found.  Updated Vital Signs BP (!) 145/94 (BP Location: Right Arm)    Pulse 98    Temp 99.3 F (37.4 C) (Oral)    Resp 18    Ht 5\' 11"  (1.803 m)    Wt 93 kg    SpO2 98%    BMI 28.59 kg/m   Visual Acuity Right Eye Distance:   Left Eye Distance:   Bilateral Distance:    Right Eye Near:   Left Eye Near:    Bilateral Near:     Physical Exam Vitals and nursing note reviewed.  Constitutional:      General: He is not in acute distress. HENT:     Head: Normocephalic.     Right Ear: External ear normal.     Left Ear: External ear normal.     Nose: Nose normal.     Mouth/Throat:     Mouth: Mucous membranes are moist.     Pharynx:  Oropharynx is clear.  Eyes:     Pupils: Pupils are equal, round, and reactive to light.  Cardiovascular:     Rate and Rhythm: Normal rate.  Pulmonary:     Effort: Pulmonary effort is normal.  Musculoskeletal:     Cervical back: Neck supple.     Right lower leg: No edema.     Left lower leg: No edema.  Lymphadenopathy:     Cervical: No cervical adenopathy.  Skin:    General: Skin is warm and dry.          Comments: 2.5cm bulla left lower medial leg; numerous smaller bulla containing clear fluid on both lower legs with surrounding erythema.  No warmth or tenderness to palpation.  No calf tenderness.  Neurological:     Mental Status: He is alert.      UC Treatments / Results  Labs (all labs ordered are listed, but only abnormal results are displayed) Labs Reviewed  POCT CBC W AUTO DIFF ([facility]):  WBC 6.3; LY 31.0; MO 6.3; GR 62.7; Hgb 14.3; Platelets 276     EKG   Radiology No results found.  Procedures Procedures (including critical care time)  Medications Ordered in UC Medications - No data to display  Initial Impression / Assessment and Plan / UC Course  I have reviewed the triage vital signs and the nursing notes.  Pertinent labs & imaging results that were available during my care of the patient were reviewed by me and considered in my medical decision making (see chart for details).    Suspect secondary bacterial cellulitis both lower legs. Normal WBC reassuring.  Begin doxycycline for staph coverage, and prednisone burst/taper.   Followup with Family Doctor if not improved in about 3 days.   Final Clinical Impressions(s) / UC Diagnoses   Final diagnoses:  Rhus dermatitis  Cellulitis of left lower extremity  Cellulitis of right lower extremity     Discharge Instructions     May take non-sedating antihistamine such as Zyrtec for itching.  If blisters on legs begin to weep, try using non-prescription DomeBoro: Dissolve 1 to 3 packets in  16 oz. Water.  Apply as a wet compress 3 to 4 times daily.   If symptoms become significantly worse during the night or over the weekend, proceed to the local emergency room.  ED Prescriptions    Medication Sig Dispense Auth. Provider   predniSONE (DELTASONE) 20 MG tablet Take one tab by mouth twice daily for 4 days, then one daily for 3 days. Take with food. 11 tablet Kandra Nicolas, MD   doxycycline (VIBRAMYCIN) 100 MG capsule Take 1 capsule (100 mg total) by mouth 2 (two) times daily. Take with food. 20 capsule Kandra Nicolas, MD        Kandra Nicolas, MD 02/14/20 1031

## 2020-02-11 NOTE — Discharge Instructions (Addendum)
May take non-sedating antihistamine such as Zyrtec for itching.  If blisters on legs begin to weep, try using non-prescription DomeBoro: Dissolve 1 to 3 packets in 16 oz. Water.  Apply as a wet compress 3 to 4 times daily.   If symptoms become significantly worse during the night or over the weekend, proceed to the local emergency room.

## 2020-03-15 ENCOUNTER — Ambulatory Visit (INDEPENDENT_AMBULATORY_CARE_PROVIDER_SITE_OTHER): Payer: BC Managed Care – PPO | Admitting: Physician Assistant

## 2020-03-15 ENCOUNTER — Encounter: Payer: Self-pay | Admitting: Physician Assistant

## 2020-03-15 VITALS — BP 119/80 | HR 81 | Ht 71.0 in | Wt 207.0 lb

## 2020-03-15 DIAGNOSIS — L255 Unspecified contact dermatitis due to plants, except food: Secondary | ICD-10-CM

## 2020-03-15 MED ORDER — PREDNISONE 20 MG PO TABS: ORAL_TABLET | ORAL | 0 refills | Status: DC

## 2020-03-15 MED ORDER — HYDROXYZINE HCL 10 MG PO TABS: ORAL_TABLET | ORAL | 0 refills | Status: DC

## 2020-03-15 MED ORDER — METHYLPREDNISOLONE SODIUM SUCC 125 MG IJ SOLR
125.0000 mg | Freq: Once | INTRAMUSCULAR | Status: AC
  Administered 2020-03-15: 125 mg via INTRAMUSCULAR

## 2020-03-15 NOTE — Progress Notes (Signed)
Rash - both ankles/feet Given prednisone/doxy 3 weeks ago and cleared up for a week and came right back Itches, some areas painful Using Calamine lotion

## 2020-03-15 NOTE — Progress Notes (Signed)
   Subjective:    Patient ID: Trevor Ball, male    DOB: 08/27/1966, 54 y.o.   MRN: [id number]  HPI Patient is a 54 year old male who presents to the clinic with poison ivy dermatitis that has developed into blisters around his ankles.  He has had reactions like this before but it has been a while.  He went to urgent care on 02/11/2020 where he was treated with prednisone and antibiotic for cellulitis.  His blisters did almost completely go away and then once he finished the prednisone the came right back.  He denies any pain, warmth or swelling.  He has a lot of itching.  He has been using calamine lotion with little relief.  It has been almost 5 weeks of symptoms.  CBC done in urgent care was normal. .. Active Ambulatory Problems    Diagnosis Date Noted  . Testicular mass 08/12/2012  . Groin pain 07/08/2015  . IFG (impaired fasting glucose) 07/09/2015  . Seborrheic dermatitis 06/29/2016  . Left shoulder pain 11/27/2019  . Elevated BP without diagnosis of hypertension 11/27/2019  . Rhus dermatitis 03/15/2020   Resolved Ambulatory Problems    Diagnosis Date Noted  . CHEST PAIN 12/03/2009   Past Medical History:  Diagnosis Date  . Renal disorder      Review of Systems See HPI.     Objective:   Physical Exam Vitals reviewed.  Constitutional:      Appearance: Normal appearance.  Cardiovascular:     Rate and Rhythm: Normal rate and regular rhythm.     Pulses: Normal pulses.     Heart sounds: Normal heart sounds.  Skin:    Comments: 3 large bullae around right ankle and evidence of erythema in circles on left ankle where previous bullae healed. Calamine lotion on both ankles. No swelling or warmth to touch.   Neurological:     General: No focal deficit present.     Mental Status: He is alert.           Assessment & Plan:  Marland KitchenMarland KitchenNyjah was seen today for rash.  Diagnoses and all orders for this visit:  Rhus dermatitis -     hydrOXYzine (ATARAX/VISTARIL) 10 MG tablet;  Take 1-2 tablets as needed up to three times a day for itching. -     predniSONE (DELTASONE) 20 MG tablet; Take 3 tablets for 3 days, take 2 tablets for 3 days, take 1 tablet for 3 days, take 1/2 tablet for 4 days. -     methylPREDNISolone sodium succinate (SOLU-MEDROL) 125 mg/2 mL injection 125 mg   Added a stronger taper of prednisone. No appearance of infection. Cellulitis has cleared. Discussed OTC domeboro soaks. Vistaril for itching. Follow up as needed or if not resolving.

## 2020-03-15 NOTE — Patient Instructions (Signed)
Domeboro soaks in 16oz of water.  Prednisone to start.  Vistaril for itching.    Poison Ivy Dermatitis Poison ivy dermatitis is inflammation of the skin that is caused by chemicals in the leaves of the poison ivy plant. The skin reaction often involves redness, swelling, blisters, and extreme itching. What are the causes? This condition is caused by a chemical (urushiol) found in the sap of the poison ivy plant. This chemical is sticky and can be easily spread to people, animals, and objects. You can get poison ivy dermatitis by:  Having direct contact with a poison ivy plant.  Touching animals, other people, or objects that have come in contact with poison ivy and have the chemical on them. What increases the risk? This condition is more likely to develop in people who:  Are outdoors often in wooded or [place] areas.  Go outdoors without wearing protective clothing, such as closed shoes, long pants, and a long-sleeved shirt. What are the signs or symptoms? Symptoms of this condition include:  Redness of the skin.  Extreme itching.  A rash that often includes bumps and blisters. The rash usually appears 48 hours after exposure, if you have been exposed before. If this is the first time you have been exposed, the rash may not appear until a week after exposure.  Swelling. This may occur if the reaction is more severe. Symptoms usually last for 1-2 weeks. However, the first time you develop this condition, symptoms may last 3-4 weeks. How is this diagnosed? This condition may be diagnosed based on your symptoms and a physical exam. Your health care provider may also ask you about any recent outdoor activity. How is this treated? Treatment for this condition will vary depending on how severe it is. Treatment may include:  Hydrocortisone cream or calamine lotion to relieve itching.  Oatmeal baths to soothe the skin.  Medicines, such as over-the-counter antihistamine tablets.  Oral  steroid medicine, for more severe reactions. Follow these instructions at home: Medicines  Take or apply over-the-counter and prescription medicines only as told by your health care provider.  Use hydrocortisone cream or calamine lotion as needed to soothe the skin and relieve itching. General instructions  Do not scratch or rub your skin.  Apply a cold, wet cloth (cold compress) to the affected areas or take baths in cool water. This will help with itching. Avoid hot baths and showers.  Take oatmeal baths as needed. Use colloidal oatmeal. You can get this at your local pharmacy or grocery store. Follow the instructions on the packaging.  While you have the rash, wash clothes right after you wear them.  Keep all follow-up visits as told by your health care provider. This is important. How is this prevented?   Learn to identify the poison ivy plant and avoid contact with the plant. This plant can be recognized by the number of leaves. Generally, poison ivy has three leaves with flowering branches on a single stem. The leaves are typically glossy, and they have jagged edges that come to a point at the front.  If you have been exposed to poison ivy, thoroughly wash with soap and water right away. You have about 30 minutes to remove the plant resin before it will cause the rash. Be sure to wash under your fingernails, because any plant resin there will continue to spread the rash.  When hiking or camping, wear clothes that will help you to avoid exposure on the skin. This includes long pants, a  long-sleeved shirt, tall socks, and hiking boots. You can also apply preventive lotion to your skin to help limit exposure.  If you suspect that your clothes or outdoor gear came in contact with poison ivy, rinse them off outside with a garden hose before you bring them inside your house.  When doing yard work or gardening, wear gloves, long sleeves, long pants, and boots. Wash your garden tools and  gloves if they come in contact with poison ivy.  If you suspect that your pet has come into contact with poison ivy, wash him or her with pet shampoo and water. Make sure to wear gloves while washing your pet. Contact a health care provider if you have:  Open sores in the rash area.  More redness, swelling, or pain in the affected area.  Redness that spreads beyond the rash area.  Fluid, blood, or pus coming from the affected area.  A fever.  A rash over a large area of your body.  A rash on your eyes, mouth, or genitals.  A rash that does not improve after a few weeks. Get help right away if:  Your face swells or your eyes swell shut.  You have trouble breathing.  You have trouble swallowing. These symptoms may represent a serious problem that is an emergency. Do not wait to see if the symptoms will go away. Get medical help right away. Call your local emergency services (911 in the [place] Do not drive yourself to the hospital. Summary  Poison ivy dermatitis is inflammation of the skin that is caused by chemicals in the leaves of the poison ivy plant.  Symptoms of this condition include redness, itching, a rash, and swelling.  Do not scratch or rub your skin.  Take or apply over-the-counter and prescription medicines only as told by your health care provider. This information is not intended to replace advice given to you by your health care provider. Make sure you discuss any questions you have with your health care provider. Document Revised: 12/16/2018 Document Reviewed: 08/19/2018 Elsevier Patient Education  2020 [employer].

## 2020-08-19 ENCOUNTER — Ambulatory Visit (INDEPENDENT_AMBULATORY_CARE_PROVIDER_SITE_OTHER): Payer: BC Managed Care – PPO | Admitting: Family Medicine

## 2020-08-19 ENCOUNTER — Encounter: Payer: Self-pay | Admitting: Family Medicine

## 2020-08-19 DIAGNOSIS — L219 Seborrheic dermatitis, unspecified: Secondary | ICD-10-CM

## 2020-08-19 DIAGNOSIS — M67431 Ganglion, right wrist: Secondary | ICD-10-CM | POA: Diagnosis not present

## 2020-08-19 MED ORDER — KETOCONAZOLE 2 % EX CREA: 1.0000 "application " | TOPICAL_CREAM | Freq: Two times a day (BID) | CUTANEOUS | 3 refills | Status: AC

## 2020-08-19 NOTE — Progress Notes (Signed)
Trevor Ball - 54 y.o. male MRN [id number]  Date of birth: Jan 23, 1966  Subjective Chief Complaint  Patient presents with  . Cyst    Right wrist    HPI Trevor Ball is a 54 y.o. male here today with complaint of swelling of his right wrist.  He would also like a refill of ketoconazole cream for treatment of his seborrheic dermatitis.  This cream has worked well for him previously.  He noticed a couple small nodules along his right wrist a few days ago.  He is unsure how long these have been present.  He denies any trauma or pain associated with this.  He does not recall any prior injury to this wrist.  ROS:  A comprehensive ROS was completed and negative except as noted per HPI  No Known Allergies  Past Medical History:  Diagnosis Date  . Renal disorder    calculi    Past Surgical History:  Procedure Laterality Date  . KIDNEY STONE SURGERY      Social History   Socioeconomic History  . Marital status: Married    Spouse name: Not on file  . Number of children: Not on file  . Years of education: Not on file  . Highest education level: Not on file  Occupational History  . Not on file  Tobacco Use  . Smoking status: Never Smoker  . Smokeless tobacco: Never Used  Substance and Sexual Activity  . Alcohol use: Yes    Alcohol/week: 0.0 standard drinks  . Drug use: No  . Sexual activity: Yes    Partners: Female  Other Topics Concern  . Not on file  Social History Narrative  . Not on file   Social Determinants of Health   Financial Resource Strain: Not on file  Food Insecurity: Not on file  Transportation Needs: Not on file  Physical Activity: Not on file  Stress: Not on file  Social Connections: Not on file    Family History  Problem Relation Age of Onset  . Heart disease Father     Health Maintenance  Topic Date Due  . Hepatitis C Screening  Never done  . INFLUENZA VACCINE  12/05/2020 (Originally 04/07/2020)  . COVID-19 Vaccine (3 - Booster for Pfizer  series) 10/07/2020  . TETANUS/TDAP  06/29/2026  . COLONOSCOPY  07/20/2027  . HIV Screening  Completed     ----------------------------------------------------------------------------------------------------------------------------------------------------------------------------------------------------------------- Physical Exam BP (!) 143/79 (BP Location: Left Arm, Patient Position: Sitting, Cuff Size: Normal)   Pulse 81   Temp 98.3 F (36.8 C)   Wt 215 lb 8 oz (97.8 kg)   SpO2 100%   BMI 30.06 kg/m   Physical Exam Constitutional:      Appearance: Normal appearance.  Musculoskeletal:     Comments: 2 small nodules along the right volar wrist.  These areas are nontender.  Skin:    General: Skin is warm and dry.  Neurological:     General: No focal deficit present.     Mental Status: He is alert.  Psychiatric:        Mood and Affect: Mood normal.        Behavior: Behavior normal.     ------------------------------------------------------------------------------------------------------------------------------------------------------------------------------------------------------------------- Assessment and Plan  Seborrheic dermatitis Prescription for ketoconazole 2% renewed.  Ganglion cyst of wrist, right Nodule with appearance of ganglion cyst.  Is not very bothersome to him at this time I think we can just monitor for now.  These may resolve on their own however if he desires treatment I  can have him see Dr. Benjamin Ball for consideration of aspiration of these.   Meds ordered this encounter  Medications  . ketoconazole (NIZORAL) 2 % cream    Sig: Apply 1 application topically 2 (two) times daily. To affected areas.    Dispense:  60 g    Refill:  3    No follow-ups on file.    This visit occurred during the SARS-CoV-2 public health emergency.  Safety protocols were in place, including screening questions prior to the visit, additional usage of staff PPE, and  extensive cleaning of exam room while observing appropriate contact time as indicated for disinfecting solutions.

## 2020-08-19 NOTE — Assessment & Plan Note (Signed)
Nodule with appearance of ganglion cyst.  Is not very bothersome to him at this time I think we can just monitor for now.  These may resolve on their own however if he desires treatment I can have him see Dr. Benjamin Stain for consideration of aspiration of these.

## 2020-08-19 NOTE — Assessment & Plan Note (Signed)
Prescription for ketoconazole 2% renewed.

## 2020-08-19 NOTE — Patient Instructions (Signed)
Ganglion Cyst  A ganglion cyst is a non-cancerous, fluid-filled lump that occurs near a joint or tendon. The cyst grows out of a joint or the lining of a tendon. Ganglion cysts most often develop in the hand or wrist, but they can also develop in the shoulder, elbow, hip, knee, ankle, or foot. Ganglion cysts are ball-shaped or egg-shaped. Their size can range from the size of a pea to larger than a grape. Increased activity may cause the cyst to get bigger because more fluid starts to build up. What are the causes? The exact cause of this condition is not known, but it may be related to:  Inflammation or irritation around the joint.  An injury.  Repetitive movements or overuse.  Arthritis. What increases the risk? You are more likely to develop this condition if:  You are a woman.  You are 15-40 years old. What are the signs or symptoms? The main symptom of this condition is a lump. It most often appears on the hand or wrist. In many cases, there are no other symptoms, but a cyst can sometimes cause:  Tingling.  Pain.  Numbness.  Muscle weakness.  Weak grip.  Less range of motion in a joint. How is this diagnosed? Ganglion cysts are usually diagnosed based on a physical exam. Your health care provider will feel the lump and may shine a light next to it. If it is a ganglion cyst, the light will likely shine through it. Your health care provider may order an X-ray, ultrasound, or MRI to rule out other conditions. How is this treated? Ganglion cysts often go away on their own without treatment. If you have pain or other symptoms, treatment may be needed. Treatment is also needed if the ganglion cyst limits your movement or if it gets infected. Treatment may include:  Wearing a brace or splint on your wrist or finger.  Taking anti-inflammatory medicine.  Having fluid drained from the lump with a needle (aspiration).  Getting a steroid injected into the joint.  Having  surgery to remove the ganglion cyst.  Placing a pad on your shoe or wearing shoes that will not rub against the cyst if it is on your foot. Follow these instructions at home:  Do not press on the ganglion cyst, poke it with a needle, or hit it.  Take over-the-counter and prescription medicines only as told by your health care provider.  If you have a brace or splint: ? Wear it as told by your health care provider. ? Remove it as told by your health care provider. Ask if you need to remove it when you take a shower or a bath.  Watch your ganglion cyst for any changes.  Keep all follow-up visits as told by your health care provider. This is important. Contact a health care provider if:  Your ganglion cyst becomes larger or more painful.  You have pus coming from the lump.  You have weakness or numbness in the affected area.  You have a fever or chills. Get help right away if:  You have a fever and have any of these in the cyst area: ? Increased redness. ? Red streaks. ? Swelling. Summary  A ganglion cyst is a non-cancerous, fluid-filled lump that occurs near a joint or tendon.  Ganglion cysts most often develop in the hand or wrist, but they can also develop in the shoulder, elbow, hip, knee, ankle, or foot.  Ganglion cysts often go away on their own without treatment.   This information is not intended to replace advice given to you by your health care provider. Make sure you discuss any questions you have with your health care provider. Document Revised: 08/06/2017 Document Reviewed: 04/23/2017 Elsevier Patient Education  2020 [employer]  

## 2021-03-31 ENCOUNTER — Encounter: Payer: BC Managed Care – PPO | Admitting: Family Medicine

## 2022-01-12 ENCOUNTER — Ambulatory Visit (INDEPENDENT_AMBULATORY_CARE_PROVIDER_SITE_OTHER): Payer: BC Managed Care – PPO | Admitting: Family Medicine

## 2022-01-12 ENCOUNTER — Ambulatory Visit (INDEPENDENT_AMBULATORY_CARE_PROVIDER_SITE_OTHER): Payer: BC Managed Care – PPO

## 2022-01-12 ENCOUNTER — Encounter: Payer: Self-pay | Admitting: Family Medicine

## 2022-01-12 VITALS — BP 117/72 | HR 72 | Temp 97.9°F | Ht 71.0 in | Wt 196.0 lb

## 2022-01-12 DIAGNOSIS — Z0000 Encounter for general adult medical examination without abnormal findings: Secondary | ICD-10-CM

## 2022-01-12 DIAGNOSIS — R0609 Other forms of dyspnea: Secondary | ICD-10-CM

## 2022-01-12 DIAGNOSIS — R634 Abnormal weight loss: Secondary | ICD-10-CM | POA: Diagnosis not present

## 2022-01-12 DIAGNOSIS — Z125 Encounter for screening for malignant neoplasm of prostate: Secondary | ICD-10-CM | POA: Diagnosis not present

## 2022-01-12 DIAGNOSIS — Z13220 Encounter for screening for lipoid disorders: Secondary | ICD-10-CM

## 2022-01-12 DIAGNOSIS — R0602 Shortness of breath: Secondary | ICD-10-CM | POA: Diagnosis not present

## 2022-01-12 NOTE — Progress Notes (Signed)
?Trevor Ball - 56 y.o. male MRN [id number]  Date of birth: 08-Nov-1965 ? ?Subjective ?Chief Complaint  ?Patient presents with  ? Annual Exam  ? ? ?HPI ?Trevor Ball is a 56 y.o. male here today for annual exam.  He reports that overall he is doing well.  Has some concerns about exertional dyspnea.  Reports that he and his sone went on a hike last year and had significant dyspnea to the point where he had to rest several times.  He has also noticed this when removing tarp from trailer or disconnecting.  He does occasionally notice some chest discomfort with this.   ? ?He has also had some unintended weight loss of about 10lbs over the past few months.  He had quit drinking beer for a few weeks but continues to lose weight since he re-started this.   ? ?He does remain pretty active.  Feels like diet is pretty good.   ? ?He is up to date on age appropriated cancer screenings.  ? ?He is up to date on recommended vaccines.  ? ?Review of Systems  ?Constitutional:  Positive for weight loss. Negative for chills, fever and malaise/fatigue.  ?HENT:  Negative for congestion, ear pain and sore throat.   ?Eyes:  Negative for blurred vision, double vision and pain.  ?Respiratory:  Positive for shortness of breath. Negative for cough.   ?Cardiovascular:  Negative for chest pain and palpitations.  ?Gastrointestinal:  Negative for abdominal pain, blood in stool, constipation, heartburn and nausea.  ?Genitourinary:  Negative for dysuria and urgency.  ?Musculoskeletal:  Negative for joint pain and myalgias.  ?Neurological:  Negative for dizziness and headaches.  ?Endo/Heme/Allergies:  Does not bruise/bleed easily.  ?Psychiatric/Behavioral:  Negative for depression. The patient is not nervous/anxious and does not have insomnia.   ? ? ?  ?No Known Allergies ? ?Past Medical History:  ?Diagnosis Date  ? Renal disorder   ? calculi  ? ? ?Past Surgical History:  ?Procedure Laterality Date  ? KIDNEY STONE SURGERY    ? ? ?Social History   ? ?Socioeconomic History  ? Marital status: Married  ?  Spouse name: Not on file  ? Number of children: Not on file  ? Years of education: Not on file  ? Highest education level: Not on file  ?Occupational History  ? Not on file  ?Tobacco Use  ? Smoking status: Never  ? Smokeless tobacco: Never  ?Substance and Sexual Activity  ? Alcohol use: Yes  ?  Alcohol/week: 0.0 standard drinks  ? Drug use: No  ? Sexual activity: Yes  ?  Partners: Female  ?Other Topics Concern  ? Not on file  ?Social History Narrative  ? Not on file  ? ?Social Determinants of Health  ? ?Financial Resource Strain: Not on file  ?Food Insecurity: Not on file  ?Transportation Needs: Not on file  ?Physical Activity: Not on file  ?Stress: Not on file  ?Social Connections: Not on file  ? ? ?Family History  ?Problem Relation Age of Onset  ? Heart disease Father   ? ? ?Health Maintenance  ?Topic Date Due  ? Hepatitis C Screening  Never done  ? COVID-19 Vaccine (3 - Booster for Pfizer series) 05/08/2022 (Originally 06/01/2020)  ? INFLUENZA VACCINE  04/07/2022  ? TETANUS/TDAP  06/29/2026  ? COLONOSCOPY (Pts 45-52yrs Insurance coverage will need to be confirmed)  07/20/2027  ? HIV Screening  Completed  ? Zoster Vaccines- Shingrix  Completed  ? HPV VACCINES  Aged  Out  ? ? ? ?----------------------------------------------------------------------------------------------------------------------------------------------------------------------------------------------------------------- ?Physical Exam ?BP 117/72 (BP Location: Left Arm, Patient Position: Sitting, Cuff Size: Small)   Pulse 72   Temp 97.9 ?F (36.6 ?C) (Oral)   Ht 5\' 11"  (1.803 m)   Wt 196 lb (88.9 kg)   SpO2 99%   BMI 27.34 kg/m?  ? ?Physical Exam ?Constitutional:   ?   General: He is not in acute distress. ?HENT:  ?   Head: Normocephalic and atraumatic.  ?   Right Ear: Tympanic membrane and external ear normal.  ?   Left Ear: Tympanic membrane and external ear normal.  ?Eyes:  ?   General: No  scleral icterus. ?Neck:  ?   Thyroid: No thyromegaly.  ?Cardiovascular:  ?   Rate and Rhythm: Normal rate and regular rhythm.  ?   Heart sounds: Normal heart sounds.  ?Pulmonary:  ?   Effort: Pulmonary effort is normal.  ?   Breath sounds: Normal breath sounds.  ?Abdominal:  ?   General: Bowel sounds are normal. There is no distension.  ?   Palpations: Abdomen is soft.  ?   Tenderness: There is no abdominal tenderness. There is no guarding.  ?Musculoskeletal:  ?   Cervical back: Normal range of motion.  ?Lymphadenopathy:  ?   Cervical: No cervical adenopathy.  ?Skin: ?   General: Skin is warm and dry.  ?   Findings: No rash.  ?Neurological:  ?   Mental Status: He is alert and oriented to person, place, and time.  ?   Cranial Nerves: No cranial nerve deficit.  ?   Motor: No abnormal muscle tone.  ?Psychiatric:     ?   Mood and Affect: Mood normal.     ?   Behavior: Behavior normal.  ? ?EKG: NSR without ischemic changes.  No significant change form 06/28/17 tracing.  ?------------------------------------------------------------------------------------------------------------------------------------------------------------------------------------------------------------------- ?Assessment and Plan ? ?Well adult exam ?Well adult ?Orders Placed This Encounter  ?Procedures  ? DG Chest 2 View  ?  Standing Status:   Future  ?  Standing Expiration Date:   01/13/2023  ?  Order Specific Question:   Reason for Exam (SYMPTOM  OR DIAGNOSIS REQUIRED)  ?  Answer:   exertional dyspnea, unintended weight loss.  ?  Order Specific Question:   Preferred imaging location?  ?  [facility]  ? COMPLETE METABOLIC PANEL WITH GFR  ? CBC with Differential  ? Lipid Panel w/reflex Direct LDL  ? PSA  ? TSH  ? Cardiac Stress Test: Informed Consent Details: Physician/Practitioner Attestation; Transcribe to consent form and obtain patient signature  ?  Order Specific Question:   Physician/Practitioner attestation of informed  consent for procedure/surgical case  ?  Answer:   I, the physician/practitioner, attest that I have discussed with the patient the benefits, risks, side effects, alternatives, likelihood of achieving goals and potential problems during recovery for the procedure that I have provided informed consent.  ?  Order Specific Question:   Procedure  ?  Answer:   Stress myoview, treadmill  ?  Order Specific Question:   Indication/Reason  ?  Answer:   Exertional dyspnea.  ? Myocardial Perfusion Imaging  ?  Standing Status:   Future  ?  Standing Expiration Date:   01/13/2023  ?  Order Specific Question:   Patient weight in lbs  ?  Answer:   196  ?  Order Specific Question:   Where should this be performed?  ?  Answer:  [facility]  ?  Order Specific Question:   Type of stress  ?  Answer:   Exercise  ? EKG 12-Lead  ?Screenings; Per lab orders ?Immunizations;  UTD ?Anticipatory guidance/Risk factor reduction:  Recommendations per AVS.  ? ?Unintended weight loss ?Checking TSH.  CXR ordered.  ? ?Exertional dyspnea ?EKG normal.  Updating labs and CXR.  Referral placed for stress myoview.  ? ? ?No orders of the defined types were placed in this encounter. ? ? ?No follow-ups on file. ? ? ? ?This visit occurred during the SARS-CoV-2 public health emergency.  Safety protocols were in place, including screening questions prior to the visit, additional usage of staff PPE, and extensive cleaning of exam room while observing appropriate contact time as indicated for disinfecting solutions.  ? ?

## 2022-01-12 NOTE — Assessment & Plan Note (Signed)
Well adult ?Orders Placed This Encounter  ?Procedures  ?? DG Chest 2 View  ?  Standing Status:   Future  ?  Standing Expiration Date:   01/13/2023  ?  Order Specific Question:   Reason for Exam (SYMPTOM  OR DIAGNOSIS REQUIRED)  ?  Answer:   exertional dyspnea, unintended weight loss.  ?  Order Specific Question:   Preferred imaging location?  ?  [facility]  ?? COMPLETE METABOLIC PANEL WITH GFR  ?? CBC with Differential  ?? Lipid Panel w/reflex Direct LDL  ?? PSA  ?? TSH  ?? Cardiac Stress Test: Informed Consent Details: Physician/Practitioner Attestation; Transcribe to consent form and obtain patient signature  ?  Order Specific Question:   Physician/Practitioner attestation of informed consent for procedure/surgical case  ?  Answer:   I, the physician/practitioner, attest that I have discussed with the patient the benefits, risks, side effects, alternatives, likelihood of achieving goals and potential problems during recovery for the procedure that I have provided informed consent.  ?  Order Specific Question:   Procedure  ?  Answer:   Stress myoview, treadmill  ?  Order Specific Question:   Indication/Reason  ?  Answer:   Exertional dyspnea.  ?? Myocardial Perfusion Imaging  ?  Standing Status:   Future  ?  Standing Expiration Date:   01/13/2023  ?  Order Specific Question:   Patient weight in lbs  ?  Answer:   196  ?  Order Specific Question:   Where should this be performed?  ?  Answer:   [facility]  ?  Order Specific Question:   Type of stress  ?  Answer:   Exercise  ?? EKG 12-Lead  ?Screenings; Per lab orders ?Immunizations;  UTD ?Anticipatory guidance/Risk factor reduction:  Recommendations per AVS.  ?

## 2022-01-12 NOTE — Patient Instructions (Signed)

## 2022-01-12 NOTE — Assessment & Plan Note (Signed)
EKG normal.  Updating labs and CXR.  Referral placed for stress myoview.  ?

## 2022-01-12 NOTE — Assessment & Plan Note (Signed)
Checking TSH.  CXR ordered.  ?

## 2022-01-13 ENCOUNTER — Encounter (HOSPITAL_COMMUNITY): Payer: Self-pay | Admitting: Family Medicine

## 2022-01-13 LAB — CBC WITH DIFFERENTIAL/PLATELET
Absolute Monocytes: 561 cells/uL (ref 200–950)
Basophils Absolute: 88 cells/uL (ref 0–200)
Basophils Relative: 1.6 %
Eosinophils Absolute: 468 cells/uL (ref 15–500)
Eosinophils Relative: 8.5 %
HCT: 41.6 % (ref 38.5–50.0)
Hemoglobin: 13.7 g/dL (ref 13.2–17.1)
Lymphs Abs: 1947 cells/uL (ref 850–3900)
MCH: 28.1 pg (ref 27.0–33.0)
MCHC: 32.9 g/dL (ref 32.0–36.0)
MCV: 85.2 fL (ref 80.0–100.0)
MPV: 11.4 fL (ref 7.5–12.5)
Monocytes Relative: 10.2 %
Neutro Abs: 2437 cells/uL (ref 1500–7800)
Neutrophils Relative %: 44.3 %
Platelets: 222 10*3/uL (ref 140–400)
RBC: 4.88 10*6/uL (ref 4.20–5.80)
RDW: 12.8 % (ref 11.0–15.0)
Total Lymphocyte: 35.4 %
WBC: 5.5 10*3/uL (ref 3.8–10.8)

## 2022-01-13 LAB — COMPLETE METABOLIC PANEL WITH GFR
AG Ratio: 1.7 (calc) (ref 1.0–2.5)
ALT: 19 U/L (ref 9–46)
AST: 19 U/L (ref 10–35)
Albumin: 4.1 g/dL (ref 3.6–5.1)
Alkaline phosphatase (APISO): 102 U/L (ref 35–144)
BUN: 17 mg/dL (ref 7–25)
CO2: 29 mmol/L (ref 20–32)
Calcium: 9.7 mg/dL (ref 8.6–10.3)
Chloride: 106 mmol/L (ref 98–110)
Creat: 0.77 mg/dL (ref 0.70–1.30)
Globulin: 2.4 g/dL (calc) (ref 1.9–3.7)
Glucose, Bld: 102 mg/dL — ABNORMAL HIGH (ref 65–99)
Potassium: 4.8 mmol/L (ref 3.5–5.3)
Sodium: 141 mmol/L (ref 135–146)
Total Bilirubin: 1.1 mg/dL (ref 0.2–1.2)
Total Protein: 6.5 g/dL (ref 6.1–8.1)
eGFR: 106 mL/min/{1.73_m2} (ref >=60)

## 2022-01-13 LAB — PSA: PSA: 1.37 ng/mL (ref <=4.00)

## 2022-01-13 LAB — LIPID PANEL W/REFLEX DIRECT LDL
Cholesterol: 141 mg/dL (ref <200)
HDL: 50 mg/dL (ref >=40)
LDL Cholesterol (Calc): 78 mg/dL (calc)
Non-HDL Cholesterol (Calc): 91 mg/dL (calc) (ref <130)
Total CHOL/HDL Ratio: 2.8 (calc) (ref <5.0)
Triglycerides: 46 mg/dL (ref <150)

## 2022-01-13 LAB — TSH: TSH: <0.01 mIU/L — ABNORMAL LOW (ref 0.40–4.50)

## 2022-01-15 ENCOUNTER — Encounter: Payer: BC Managed Care – PPO | Admitting: Family Medicine

## 2022-01-16 ENCOUNTER — Other Ambulatory Visit: Payer: Self-pay | Admitting: Family Medicine

## 2022-01-16 DIAGNOSIS — R7989 Other specified abnormal findings of blood chemistry: Secondary | ICD-10-CM

## 2022-01-16 MED ORDER — PREDNISONE 50 MG PO TABS: ORAL_TABLET | ORAL | 0 refills | Status: DC

## 2022-01-22 ENCOUNTER — Telehealth (HOSPITAL_COMMUNITY): Payer: Self-pay | Admitting: *Deleted

## 2022-01-22 NOTE — Telephone Encounter (Signed)
Close encounter 

## 2022-01-23 ENCOUNTER — Ambulatory Visit (HOSPITAL_COMMUNITY)
Admission: RE | Admit: 2022-01-23 | Discharge: 2022-01-23 | Disposition: A | Discharge status: Home / Self Care | Payer: BC Managed Care – PPO | Source: Ambulatory Visit | Attending: Cardiovascular Disease | Admitting: Cardiovascular Disease

## 2022-01-23 DIAGNOSIS — R0609 Other forms of dyspnea: Secondary | ICD-10-CM | POA: Diagnosis not present

## 2022-01-23 LAB — MYOCARDIAL PERFUSION IMAGING
Angina Index: 0
Duke Treadmill Score: 10
Estimated workload: 10.1
Exercise duration (min): 9 min
Exercise duration (sec): 30 s
LV dias vol: 150 mL (ref 62–150)
LV sys vol: 67 mL
MPHR: 165 {beats}/min
Nuc Stress EF: 55 %
Peak HR: 148 {beats}/min
Percent HR: 89 %
Rest HR: 64 {beats}/min
Rest Nuclear Isotope Dose: 10.4 mCi
SDS: 0
SRS: 1
SSS: 1
ST Depression (mm): 0 mm
Stress Nuclear Isotope Dose: 28.5 mCi
TID: 0.99

## 2022-01-23 MED ORDER — TECHNETIUM TC 99M TETROFOSMIN IV KIT
28.5000 | PACK | Freq: Once | INTRAVENOUS | Status: AC | PRN
  Administered 2022-01-23: 28.5 via INTRAVENOUS

## 2022-01-23 MED ORDER — TECHNETIUM TC 99M TETROFOSMIN IV KIT
10.4000 | PACK | Freq: Once | INTRAVENOUS | Status: AC | PRN
  Administered 2022-01-23: 10.4 via INTRAVENOUS

## 2022-01-26 DIAGNOSIS — R7989 Other specified abnormal findings of blood chemistry: Secondary | ICD-10-CM | POA: Diagnosis not present

## 2022-01-27 ENCOUNTER — Other Ambulatory Visit: Payer: Self-pay | Admitting: Family Medicine

## 2022-01-27 DIAGNOSIS — E0590 Thyrotoxicosis, unspecified without thyrotoxic crisis or storm: Secondary | ICD-10-CM

## 2022-01-27 LAB — TSH+FREE T4: TSH W/REFLEX TO FT4: <0.01 mIU/L — ABNORMAL LOW (ref 0.40–4.50)

## 2022-01-27 LAB — T4, FREE: Free T4: 2.7 ng/dL — ABNORMAL HIGH (ref 0.8–1.8)

## 2022-01-27 MED ORDER — METHIMAZOLE 5 MG PO TABS: 5.0000 mg | ORAL_TABLET | Freq: Every day | ORAL | 1 refills | Status: DC

## 2022-02-02 ENCOUNTER — Encounter: Payer: Self-pay | Admitting: Family Medicine

## 2022-02-02 DIAGNOSIS — E0590 Thyrotoxicosis, unspecified without thyrotoxic crisis or storm: Secondary | ICD-10-CM

## 2022-03-02 DIAGNOSIS — E0590 Thyrotoxicosis, unspecified without thyrotoxic crisis or storm: Secondary | ICD-10-CM | POA: Diagnosis not present

## 2022-03-03 LAB — T4, FREE: Free T4: 1.5 ng/dL (ref 0.8–1.8)

## 2022-03-13 ENCOUNTER — Encounter: Payer: Self-pay | Admitting: Family Medicine

## 2022-03-13 MED ORDER — METHIMAZOLE 5 MG PO TABS: 5.0000 mg | ORAL_TABLET | Freq: Every day | ORAL | 1 refills | Status: DC

## 2022-03-29 ENCOUNTER — Other Ambulatory Visit: Payer: Self-pay | Admitting: Family Medicine

## 2022-06-15 DIAGNOSIS — E0590 Thyrotoxicosis, unspecified without thyrotoxic crisis or storm: Secondary | ICD-10-CM | POA: Diagnosis not present

## 2022-08-03 ENCOUNTER — Encounter: Payer: Self-pay | Admitting: Family Medicine

## 2022-08-03 ENCOUNTER — Ambulatory Visit: Payer: BC Managed Care – PPO | Admitting: Family Medicine

## 2022-08-03 VITALS — BP 149/82 | HR 85 | Ht 71.0 in | Wt 211.0 lb

## 2022-08-03 DIAGNOSIS — L918 Other hypertrophic disorders of the skin: Secondary | ICD-10-CM

## 2022-08-03 DIAGNOSIS — L821 Other seborrheic keratosis: Secondary | ICD-10-CM

## 2022-08-03 NOTE — Progress Notes (Signed)
Trevor Ball - 56 y.o. male MRN [id number]  Date of birth: 19-Apr-1966  Subjective Chief Complaint  Patient presents with   Nevus    HPI Jeffie is a 56 year old male here today with concern about skin lesion.  He has had area just below the right axilla for several years.  Recently the texture has changed and has become more rough in texture.  Area is more itchy as well.  He denies any bleeding or pain associated with this.  ROS:  A comprehensive ROS was completed and negative except as noted per HPI  No Known Allergies  Past Medical History:  Diagnosis Date   Renal disorder    calculi    Past Surgical History:  Procedure Laterality Date   KIDNEY STONE SURGERY      Social History   Socioeconomic History   Marital status: Married    Spouse name: Not on file   Number of children: Not on file   Years of education: Not on file   Highest education level: Not on file  Occupational History   Not on file  Tobacco Use   Smoking status: Never   Smokeless tobacco: Never  Substance and Sexual Activity   Alcohol use: Yes    Alcohol/week: 0.0 standard drinks of alcohol   Drug use: No   Sexual activity: Yes    Partners: Female  Other Topics Concern   Not on file  Social History Narrative   Not on file   Social Determinants of Health   Financial Resource Strain: Not on file  Food Insecurity: Not on file  Transportation Needs: Not on file  Physical Activity: Not on file  Stress: Not on file  Social Connections: Not on file    Family History  Problem Relation Age of Onset   Heart disease Father     Health Maintenance  Topic Date Due   Hepatitis C Screening  Never done   COVID-19 Vaccine (3 - 2023-24 season) 10/08/2022 (Originally 05/08/2022)   INFLUENZA VACCINE  12/06/2022 (Originally 04/07/2022)   COLONOSCOPY (Pts 45-22yrs Insurance coverage will need to be confirmed)  07/20/2027   HIV Screening  Completed   Zoster Vaccines- Shingrix  Completed   HPV VACCINES   Aged Out     ----------------------------------------------------------------------------------------------------------------------------------------------------------------------------------------------------------------- Physical Exam BP (!) 149/82 (BP Location: Left Arm, Patient Position: Sitting, Cuff Size: Normal)   Pulse 85   Ht 5\' 11"  (1.803 m)   Wt 211 lb (95.7 kg)   SpO2 99%   BMI 29.43 kg/m   Physical Exam Constitutional:      Appearance: Normal appearance.  HENT:     Head: Normocephalic and atraumatic.  Skin:    Comments: There is a slightly raised, pigmented, rough textured, stuck on appearing lesion just below the right axilla.  There is an adjacent pedunculated skin tag as well.  Neurological:     Mental Status: He is alert.    Procedure note: Treat with liquid nitrogen discussed with patient.  We discussed adverse effects of treatment including infection, damage of adjacent skin structures and hypopigmentation.  He wishes to proceed with treatment.  Skin tag was treated with 2 freeze thaw cycles achieving approximately 2 mm Frost ring around the base of the skin tag.  Additional lesion was treated in similar fashion using 3 freeze thaw cycles, achieving 2 mm Frost cycle.  He tolerated this well.  Postprocedure instructions given. ------------------------------------------------------------------------------------------------------------------------------------------------------------------------------------------------------------------- Assessment and Plan  Seborrheic keratosis Treated with liquid nitrogen.  See procedure note.  Skin tag Treated with liquid nitrogen today.   No orders of the defined types were placed in this encounter.   No follow-ups on file.    This visit occurred during the SARS-CoV-2 public health emergency.  Safety protocols were in place, including screening questions prior to the visit, additional usage of staff PPE, and extensive  cleaning of exam room while observing appropriate contact time as indicated for disinfecting solutions.

## 2022-08-03 NOTE — Assessment & Plan Note (Signed)
Treated with liquid nitrogen.  See procedure note.  

## 2022-08-03 NOTE — Patient Instructions (Signed)
Seborrheic Keratosis A seborrheic keratosis is a common, noncancerous (benign) skin growth. These growths are velvety, waxy, or rough spots that appear on the skin. They are often tan, brown, or black. The skin growths can be flat or raised and may be scaly. What are the causes? The cause of this condition is not known. What increases the risk? You are more likely to develop this condition if you: Have a family history of seborrheic keratosis. Are 50 years old or older. Are pregnant. Have had estrogen replacement therapy. What are the signs or symptoms? Symptoms of this condition include growths on the face, chest, shoulders, back, or other areas. These growths: Are usually painless, but may become irritated and itchy. Can be tan, yellow, brown, black, or other colors. Are slightly raised or have a flat surface. Are sometimes rough or wart-like in texture. Are often velvety or waxy on the surface. Are round or oval-shaped. Often occur in groups, but may occur as a single growth. How is this diagnosed? This condition is diagnosed with a medical history and physical exam. A sample of the growth may be tested (skin biopsy). You may also need to see a skin specialist (dermatologist). How is this treated? Treatment is not usually needed for this condition unless the growths are irritated or bleed often. You may also choose to have the growths removed if you do not like their appearance. Growth removal may include a procedure in which: Liquid nitrogen is applied to "freeze" off the growth (cryosurgery). This is the most common procedure. The growth is burned off with electricity (electrocautery). The growth is removed by scraping (curettage). Follow these instructions at home: Watch your growth or growths for any changes. Do not scratch or pick at the growth or growths. This can cause them to become irritated or infected. Contact a health care provider if: You suddenly have many new  growths. Your growth bleeds, itches, or hurts. Your growth suddenly becomes larger or changes color. Summary A seborrheic keratosis is a common, noncancerous skin growth. Treatment is not usually needed for this condition unless the growths are irritated or bleed often. Watch your growth or growths for any changes. Contact a health care provider if you suddenly have many new growths or your growth suddenly becomes larger or changes color. This information is not intended to replace advice given to you by your health care provider. Make sure you discuss any questions you have with your health care provider. Document Revised: 11/07/2021 Document Reviewed: 11/07/2021 Elsevier Patient Education  2023 Elsevier Inc.  

## 2022-08-03 NOTE — Assessment & Plan Note (Signed)
Treated with liquid nitrogen today.  

## 2022-09-21 DIAGNOSIS — E0590 Thyrotoxicosis, unspecified without thyrotoxic crisis or storm: Secondary | ICD-10-CM | POA: Diagnosis not present

## 2022-12-12 ENCOUNTER — Other Ambulatory Visit: Payer: Self-pay | Admitting: Family Medicine

## 2022-12-12 DIAGNOSIS — E0590 Thyrotoxicosis, unspecified without thyrotoxic crisis or storm: Secondary | ICD-10-CM

## 2022-12-14 NOTE — Telephone Encounter (Signed)
Called patient, patient states that he has seen a specialist Dr. Lalla Brothers concerning his thyroid, informed patient of 30 day medication refill being sent, thanks.

## 2022-12-14 NOTE — Telephone Encounter (Signed)
Pls contact the patient to schedule appt with Dr. Ashley Royalty. Sending 30 day medication refill. Last seen 01/12/22. Thanks

## 2023-01-07 ENCOUNTER — Other Ambulatory Visit: Payer: Self-pay | Admitting: Family Medicine

## 2023-01-07 DIAGNOSIS — E0590 Thyrotoxicosis, unspecified without thyrotoxic crisis or storm: Secondary | ICD-10-CM

## 2023-03-22 DIAGNOSIS — E0590 Thyrotoxicosis, unspecified without thyrotoxic crisis or storm: Secondary | ICD-10-CM | POA: Diagnosis not present

## 2023-12-02 IMAGING — DX DG CHEST 2V
2 series · 2 of 2 positions shown · non-contrast
Comparison: Chest x-ray 06/28/2017.

CLINICAL DATA: Shortness of breath.

EXAM:
CHEST - 2 VIEW

[chest pa]
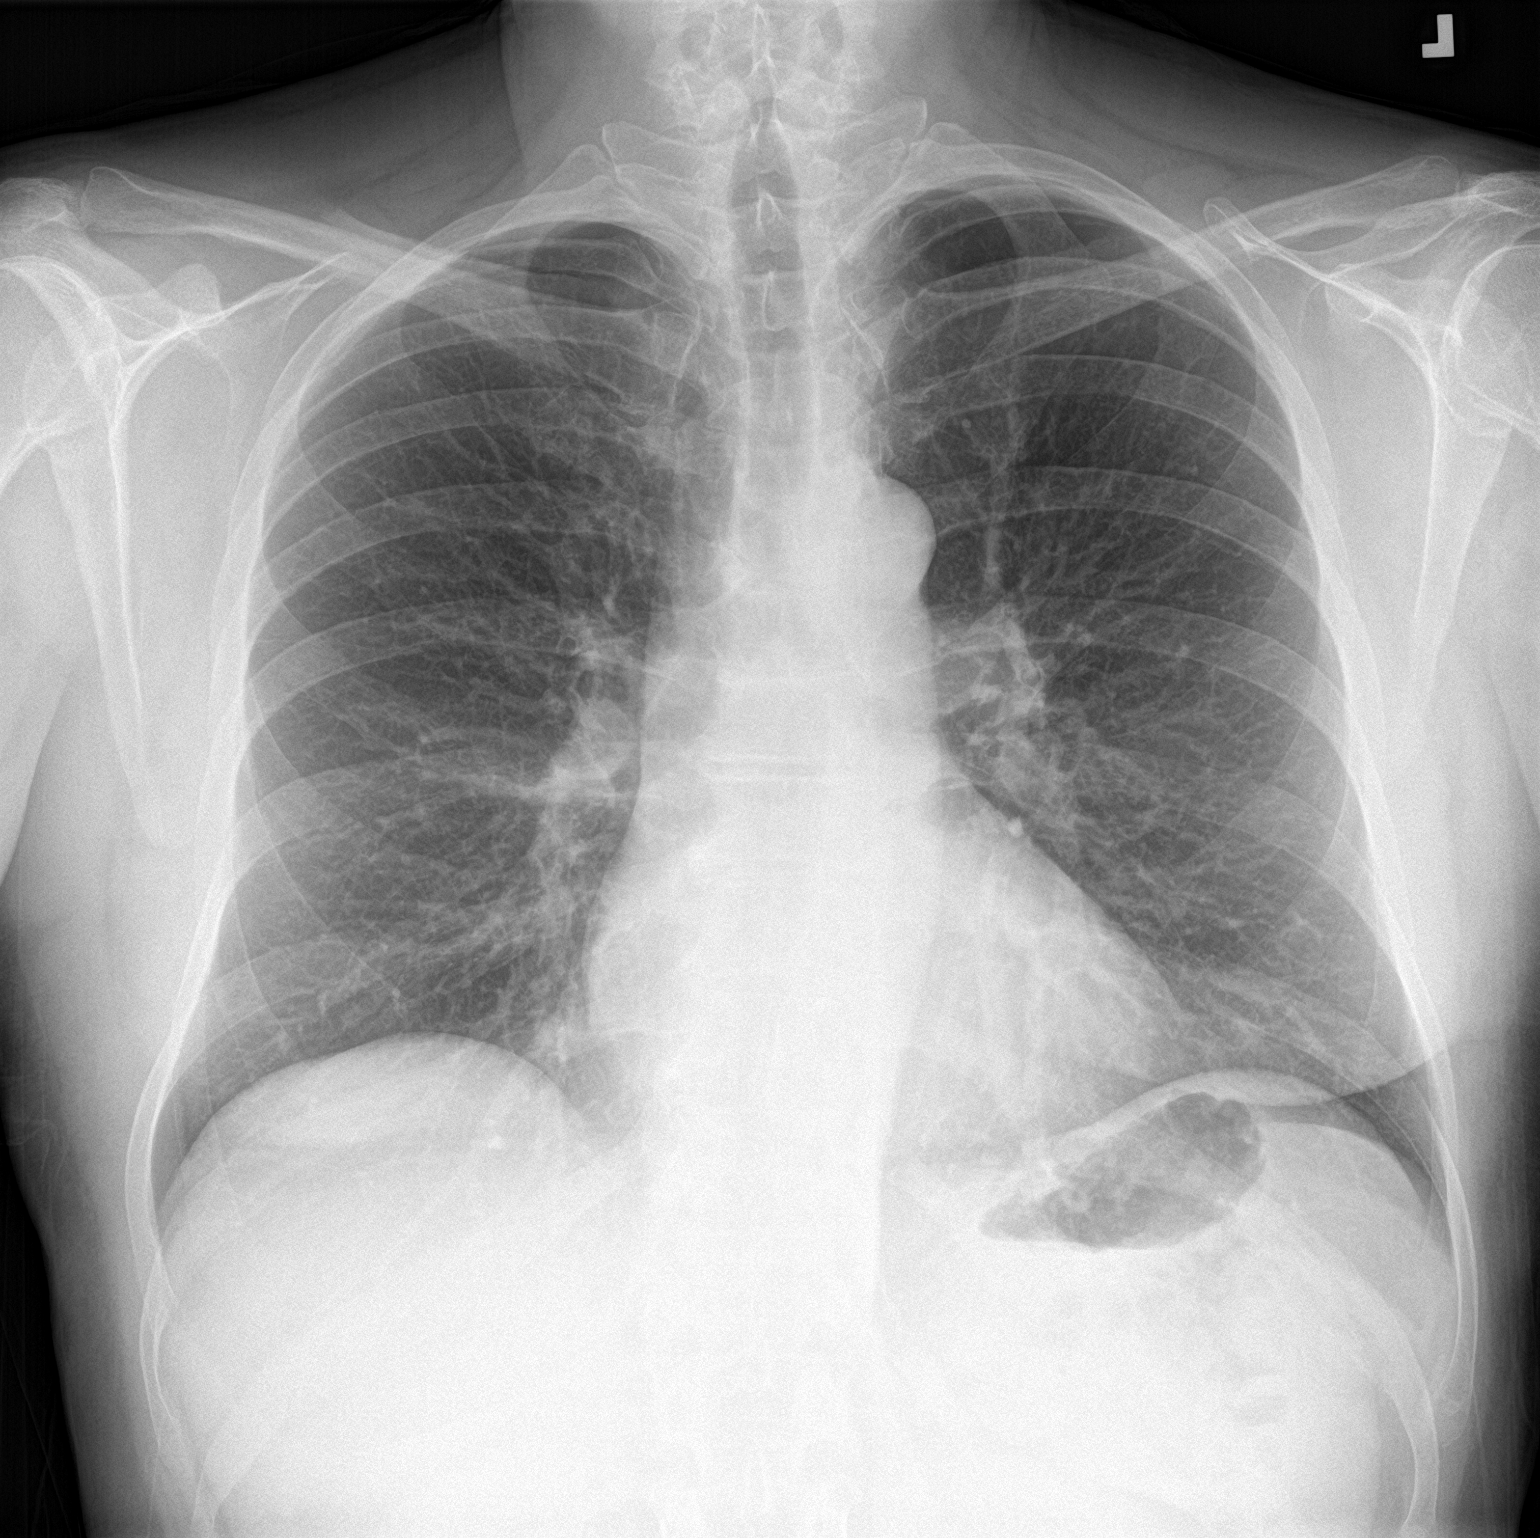

[chest lat]
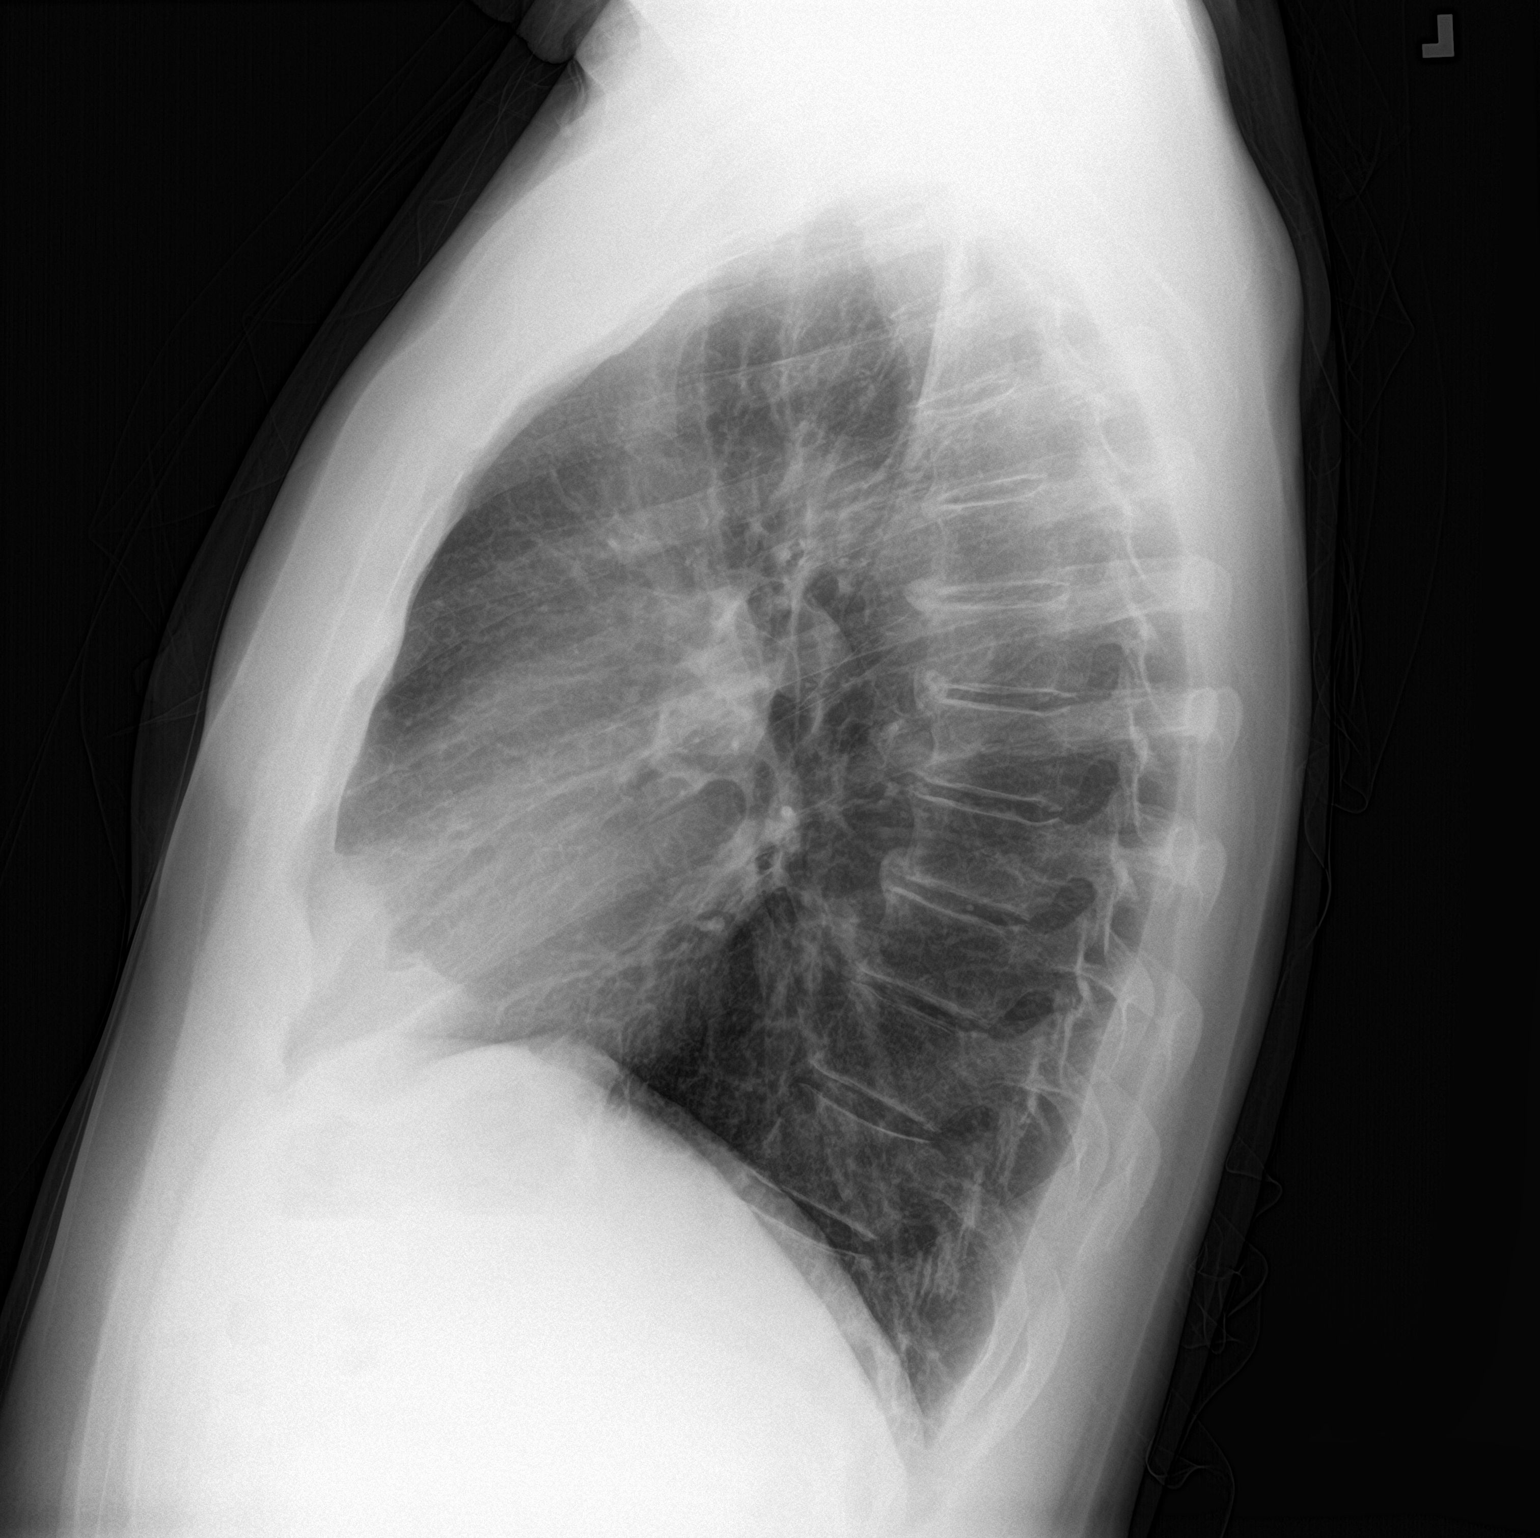

[2 of 2 positions shown; findings below may reference images not displayed]

FINDINGS: Mediastinum hilar structures normal. Heart size stable. No pulmonary
venous congestion. Mild peribronchial cuffing noted. Bronchitis
cannot be excluded. Low lung volumes with mild bibasilar
atelectasis. No pleural effusion or pneumothorax. Degenerative
change thoracic spine.
IMPRESSION: Mild peribronchial cuffing noted. Bronchitis cannot be excluded. No
focal infiltrate. Low lung volumes with mild bibasilar atelectasis.

## 2024-01-14 ENCOUNTER — Ambulatory Visit: Payer: Self-pay

## 2024-01-14 NOTE — Telephone Encounter (Signed)
  Chief Complaint:Toe Pain and swelling Symptoms: swelling to left big toe with pus along with some pain Frequency: unknown amount of time but patient states swelling has gotten worse Pertinent Negatives: Patient denies fever Disposition: [] ED /[x] Urgent Care (no appt availability in office) / [] Appointment(In office/virtual)/ []  [facility]/ [] Home Care/ [] Refused Recommended Disposition /[]  Mobile Bus/ []  Follow-up with PCP Additional Notes: patient calling with left big toe swelling an pus coming from toenail. Patient swelling has been going on for an unknown amount of time but patient states swelling has gotten worse in the last couple of days. Per protocol, patient is recommended to be seen in the next 24 hours. Patient verbalized understanding of the plan and all questions answered.    Copied from CRM [id number]. Topic: Clinical - Red Word Triage >> Jan 14, 2024  3:17 PM Kevelyn M wrote: Red Word that prompted transfer to Nurse Triage: patient calling in about swelling and pus coming out of his left big toe. Reason for Disposition  Yellow pus seen in skin around toenail (cuticle area), or pus seen under toenail  Answer Assessment - Initial Assessment Questions 1. ONSET: "When did the pain start?"      Unknown amount of time 2. LOCATION: "Where is the pain located?"   (e.g., around nail, entire toe, at foot joint)      Around the toenail-right side of toe is quite swollen 3. PAIN: "How bad is the pain?"    (Scale 1-10; or mild, moderate, severe)   -  MILD (1-3): doesn't interfere with normal activities    -  MODERATE (4-7): interferes with normal activities (e.g., work or school) or awakens from sleep, limping    -  SEVERE (8-10): excruciating pain, unable to do any normal activities, unable to walk     Mild 4. APPEARANCE: "What does the toe look like?" (e.g., redness, swelling, bruising, pallor)     Swelling to left big toe with pus 5. CAUSE: "What do you think is  causing the toe pain?"     Concerned for infection 6. OTHER SYMPTOMS: "Do you have any other symptoms?" (e.g., leg pain, rash, fever, numbness)     no  Protocols used: Toe Pain-A-AH

## 2024-01-14 NOTE — Telephone Encounter (Signed)
 FYI

## 2024-06-13 ENCOUNTER — Encounter: Payer: Self-pay | Admitting: Family Medicine

## 2024-06-13 ENCOUNTER — Ambulatory Visit (INDEPENDENT_AMBULATORY_CARE_PROVIDER_SITE_OTHER): Admitting: Family Medicine

## 2024-06-13 VITALS — BP 128/81 | HR 63 | Ht 71.0 in | Wt 213.0 lb

## 2024-06-13 DIAGNOSIS — E0590 Thyrotoxicosis, unspecified without thyrotoxic crisis or storm: Secondary | ICD-10-CM

## 2024-06-13 DIAGNOSIS — Z13220 Encounter for screening for lipoid disorders: Secondary | ICD-10-CM | POA: Diagnosis not present

## 2024-06-13 DIAGNOSIS — Z0000 Encounter for general adult medical examination without abnormal findings: Secondary | ICD-10-CM

## 2024-06-13 DIAGNOSIS — Z125 Encounter for screening for malignant neoplasm of prostate: Secondary | ICD-10-CM | POA: Diagnosis not present

## 2024-06-13 NOTE — Progress Notes (Signed)
 Trevor Ball - 58 y.o. male MRN [id number]  Date of birth: 02/05/1966  Subjective Chief Complaint  Patient presents with   Annual Exam    HPI Trevor Ball is a 58 y.o. male here today for annual exam.   He reports that he is doing well.  Continues to see Endocrinology for thyroid  disease.  Methimazole  has been reduced to every other day. SABRA   He continues to stay moderately active.  He feels that diet is pretty good.   He is a non-smoker.  No EtOH at this time.   Review of Systems  Constitutional:  Negative for chills, fever, malaise/fatigue and weight loss.  HENT:  Negative for congestion, ear pain and sore throat.   Eyes:  Negative for blurred vision, double vision and pain.  Respiratory:  Negative for cough and shortness of breath.   Cardiovascular:  Negative for chest pain and palpitations.  Gastrointestinal:  Negative for abdominal pain, blood in stool, constipation, heartburn and nausea.  Genitourinary:  Negative for dysuria and urgency.  Musculoskeletal:  Negative for joint pain and myalgias.  Neurological:  Negative for dizziness and headaches.  Endo/Heme/Allergies:  Does not bruise/bleed easily.  Psychiatric/Behavioral:  Negative for depression. The patient is not nervous/anxious and does not have insomnia.     No Known Allergies  Past Medical History:  Diagnosis Date   Renal disorder    calculi    Past Surgical History:  Procedure Laterality Date   KIDNEY STONE SURGERY      Social History   Socioeconomic History   Marital status: Married    Spouse name: Not on file   Number of children: Not on file   Years of education: Not on file   Highest education level: Not on file  Occupational History   Not on file  Tobacco Use   Smoking status: Never   Smokeless tobacco: Never  Substance and Sexual Activity   Alcohol use: Yes    Alcohol/week: 0.0 standard drinks of alcohol   Drug use: No   Sexual activity: Yes    Partners: Female  Other Topics  Concern   Not on file  Social History Narrative   Not on file   Social Drivers of Health   Financial Resource Strain: Not on file  Food Insecurity: No Food Insecurity (06/15/2022)   [facility] from [facility]   Hunger Vital Sign    Within the past 12 months, you worried that your food would run out before you got the money to buy more.: Never true    Within the past 12 months, the food you bought just didn't last and you didn't have money to get more.: Never true  Transportation Needs: Not on file  Physical Activity: Not on file  Stress: Not on file  Social Connections: Unknown (03/20/2022)   [facility] from [facility]   Social Network    Social Network: Not on file    Family History  Problem Relation Age of Onset   Heart disease Father     Health Maintenance  Topic Date Due   Influenza Vaccine  12/05/2024 (Originally 04/07/2024)   Pneumococcal Vaccine: 50+ Years (1 of 1 - PCV) 06/13/2025 (Originally 02/25/2016)   Hepatitis B Vaccines 19-59 Average Risk (1 of 3 - 19+ 3-dose series) 06/13/2025 (Originally 02/24/1985)   Hepatitis C Screening  06/13/2025 (Originally 02/25/1984)   COVID-19 Vaccine (3 - Pfizer risk series) 06/29/2025 (Originally 05/04/2020)   DTaP/Tdap/Td (2 - Td or Tdap) 06/29/2026   Colonoscopy  07/20/2027  HIV Screening  Completed   Zoster Vaccines- Shingrix  Completed   HPV VACCINES  Aged Out   Meningococcal B Vaccine  Aged Out     ----------------------------------------------------------------------------------------------------------------------------------------------------------------------------------------------------------------- Physical Exam BP 128/81 (BP Location: Left Arm, Patient Position: Sitting, Cuff Size: Normal)   Pulse 63   Ht 5' 11 (1.803 m)   Wt 213 lb (96.6 kg)   SpO2 99%   BMI 29.71 kg/m   Physical Exam Constitutional:      General: He is not in acute distress. HENT:     Head: Normocephalic and atraumatic.     Right Ear:  Tympanic membrane and external ear normal.     Left Ear: Tympanic membrane and external ear normal.  Eyes:     General: No scleral icterus. Neck:     Thyroid : No thyromegaly.  Cardiovascular:     Rate and Rhythm: Normal rate and regular rhythm.     Heart sounds: Normal heart sounds.  Pulmonary:     Effort: Pulmonary effort is normal.     Breath sounds: Normal breath sounds.  Abdominal:     General: Bowel sounds are normal. There is no distension.     Palpations: Abdomen is soft.     Tenderness: There is no abdominal tenderness. There is no guarding.  Musculoskeletal:     Cervical back: Normal range of motion.  Lymphadenopathy:     Cervical: No cervical adenopathy.  Skin:    General: Skin is warm and dry.     Findings: No rash.  Neurological:     Mental Status: He is alert and oriented to person, place, and time.     Cranial Nerves: No cranial nerve deficit.     Motor: No abnormal muscle tone.  Psychiatric:        Mood and Affect: Mood normal.        Behavior: Behavior normal.     ------------------------------------------------------------------------------------------------------------------------------------------------------------------------------------------------------------------- Assessment and Plan  Well adult exam Well adult Orders Placed This Encounter  Procedures   CMP14+EGFR   CBC with Differential/Platelet   Lipid Panel With LDL/HDL Ratio   PSA   TSH + free T4  Screenings; Per lab orders Immunizations;  UTD Anticipatory guidance/Risk factor reduction:  Recommendations per AVS.    No orders of the defined types were placed in this encounter.   No follow-ups on file.

## 2024-06-13 NOTE — Assessment & Plan Note (Signed)
 Well adult Orders Placed This Encounter  Procedures   CMP14+EGFR   CBC with Differential/Platelet   Lipid Panel With LDL/HDL Ratio   PSA   TSH + free T4  Screenings; Per lab orders Immunizations;  UTD Anticipatory guidance/Risk factor reduction:  Recommendations per AVS.

## 2024-06-13 NOTE — Patient Instructions (Signed)

## 2024-06-14 LAB — CBC WITH DIFFERENTIAL/PLATELET
Basophils Absolute: 0.1 x10E3/uL (ref 0.0–0.2)
Basos: 2 %
EOS (ABSOLUTE): 0.3 x10E3/uL (ref 0.0–0.4)
Eos: 5 %
Hematocrit: 43.2 % (ref 37.5–51.0)
Hemoglobin: 13.9 g/dL (ref 13.0–17.7)
Immature Grans (Abs): 0 x10E3/uL (ref 0.0–0.1)
Immature Granulocytes: 0 %
Lymphocytes Absolute: 1.9 x10E3/uL (ref 0.7–3.1)
Lymphs: 31 %
MCH: 29.9 pg (ref 26.6–33.0)
MCHC: 32.2 g/dL (ref 31.5–35.7)
MCV: 93 fL (ref 79–97)
Monocytes Absolute: 0.5 x10E3/uL (ref 0.1–0.9)
Monocytes: 9 %
Neutrophils Absolute: 3.3 x10E3/uL (ref 1.4–7.0)
Neutrophils: 52 %
Platelets: 257 x10E3/uL (ref 150–450)
RBC: 4.65 x10E6/uL (ref 4.14–5.80)
RDW: 12.5 % (ref 11.6–15.4)
WBC: 6.1 x10E3/uL (ref 3.4–10.8)

## 2024-06-14 LAB — LIPID PANEL WITH LDL/HDL RATIO
Cholesterol, Total: 204 mg/dL — ABNORMAL HIGH (ref 100–199)
HDL: 63 mg/dL (ref >39)
LDL Chol Calc (NIH): 126 mg/dL — ABNORMAL HIGH (ref 0–99)
LDL/HDL Ratio: 2 ratio (ref 0.0–3.6)
Triglycerides: 84 mg/dL (ref 0–149)
VLDL Cholesterol Cal: 15 mg/dL (ref 5–40)

## 2024-06-14 LAB — CMP14+EGFR
ALT: 21 IU/L (ref 0–44)
AST: 22 IU/L (ref 0–40)
Albumin: 4.4 g/dL (ref 3.8–4.9)
Alkaline Phosphatase: 67 IU/L (ref 47–123)
BUN/Creatinine Ratio: 22 — ABNORMAL HIGH (ref 9–20)
BUN: 22 mg/dL (ref 6–24)
Bilirubin Total: 0.5 mg/dL (ref 0.0–1.2)
CO2: 23 mmol/L (ref 20–29)
Calcium: 9.3 mg/dL (ref 8.7–10.2)
Chloride: 100 mmol/L (ref 96–106)
Creatinine, Ser: 1.01 mg/dL (ref 0.76–1.27)
Globulin, Total: 2.2 g/dL (ref 1.5–4.5)
Glucose: 92 mg/dL (ref 70–99)
Potassium: 4.3 mmol/L (ref 3.5–5.2)
Sodium: 139 mmol/L (ref 134–144)
Total Protein: 6.6 g/dL (ref 6.0–8.5)
eGFR: 86 mL/min/1.73 (ref >59)

## 2024-06-14 LAB — PSA: Prostate Specific Ag, Serum: 1.6 ng/mL (ref 0.0–4.0)

## 2024-06-14 LAB — TSH+FREE T4
Free T4: 1.29 ng/dL (ref 0.82–1.77)
TSH: 2.19 u[IU]/mL (ref 0.450–4.500)

## 2024-06-23 ENCOUNTER — Ambulatory Visit: Payer: Self-pay | Admitting: Family Medicine

## 2024-10-03 ENCOUNTER — Ambulatory Visit: Admitting: Family Medicine

## 2024-10-03 ENCOUNTER — Encounter: Payer: Self-pay | Admitting: Family Medicine

## 2024-10-03 VITALS — BP 127/79 | HR 89 | Ht 71.0 in | Wt 218.0 lb

## 2024-10-03 DIAGNOSIS — R1031 Right lower quadrant pain: Secondary | ICD-10-CM | POA: Diagnosis not present

## 2024-10-03 LAB — POCT URINALYSIS DIP (CLINITEK)
Bilirubin, UA: NEGATIVE
Blood, UA: NEGATIVE
Glucose, UA: NEGATIVE mg/dL
Ketones, POC UA: NEGATIVE mg/dL
Leukocytes, UA: NEGATIVE
Nitrite, UA: NEGATIVE
POC PROTEIN,UA: NEGATIVE
Spec Grav, UA: 1.02
Urobilinogen, UA: 0.2 U/dL
pH, UA: 7

## 2024-10-03 MED ORDER — SULFAMETHOXAZOLE-TRIMETHOPRIM 800-160 MG PO TABS: 1.0000 | ORAL_TABLET | Freq: Two times a day (BID) | ORAL | 0 refills | Status: AC

## 2024-10-03 NOTE — Progress Notes (Signed)
 " Trevor Ball - 59 y.o. male MRN [id number]  Date of birth: 25-Sep-1965  Subjective Chief Complaint  Patient presents with   Abdominal Pain    HPI Trevor Ball is a 59 y.o. male here today with complaint of RLQ pain.  Symptoms started about 10 days ago.  Pain seems to be worse in the mornings.   He has had some feelings of incomplete voiding and noticed some blood in his underwear after intercourse.  He has not noticed blood in his urine. He denies fever or chills.  Bowels are moving normally.  No nausea or vomiting.  Appetite is ok.    ROS:  A comprehensive ROS was completed and negative except as noted per HPI  Allergies[1]  Past Medical History:  Diagnosis Date   Renal disorder    calculi    Past Surgical History:  Procedure Laterality Date   KIDNEY STONE SURGERY      Social History   Socioeconomic History   Marital status: Married    Spouse name: Not on file   Number of children: Not on file   Years of education: Not on file   Highest education level: Not on file  Occupational History   Not on file  Tobacco Use   Smoking status: Never   Smokeless tobacco: Never  Substance and Sexual Activity   Alcohol use: Yes    Alcohol/week: 0.0 standard drinks of alcohol   Drug use: No   Sexual activity: Yes    Partners: Female  Other Topics Concern   Not on file  Social History Narrative   Not on file   Social Drivers of Health   Tobacco Use: Low Risk (10/03/2024)   Patient History    Smoking Tobacco Use: Never    Smokeless Tobacco Use: Never    Passive Exposure: Not on file  Financial Resource Strain: Low Risk (06/13/2024)   Overall Financial Resource Strain (CARDIA)    Difficulty of Paying Living Expenses: Not hard at all  Food Insecurity: No Food Insecurity (06/13/2024)   Epic    Worried About Programme Researcher, Broadcasting/film/video in the Last Year: Never true    Ran Out of Food in the Last Year: Never true  Transportation Needs: No Transportation Needs (06/13/2024)   Epic     Lack of Transportation (Medical): No    Lack of Transportation (Non-Medical): No  Physical Activity: Sufficiently Active (06/13/2024)   Exercise Vital Sign    Days of Exercise per Week: 5 days    Minutes of Exercise per Session: 60 min  Stress: No Stress Concern Present (06/13/2024)   [employer] of Occupational Health - Occupational Stress Questionnaire    Feeling of Stress: Not at all  Social Connections: Socially Isolated (06/13/2024)   Social Connection and Isolation Panel    Frequency of Communication with Friends and Family: Once a week    Frequency of Social Gatherings with Friends and Family: Once a week    Attends Religious Services: Never    Database Administrator or Organizations: No    Attends Banker Meetings: Never    Marital Status: Married  Depression (PHQ2-9): Low Risk (10/03/2024)   Depression (PHQ2-9)    PHQ-2 Score: 0  Alcohol Screen: Low Risk (06/13/2024)   Alcohol Screen    Last Alcohol Screening Score (AUDIT): 0  Housing: Low Risk (06/13/2024)   Epic    Unable to Pay for Housing in the Last Year: No    Number of Times Moved  in the Last Year: 0    Homeless in the Last Year: No  Utilities: Not At Risk (06/13/2024)   Epic    Threatened with loss of utilities: No  Health Literacy: Adequate Health Literacy (06/13/2024)   B1300 Health Literacy    Frequency of need for help with medical instructions: Never    Family History  Problem Relation Age of Onset   Heart disease Father     Health Maintenance  Topic Date Due   Influenza Vaccine  12/05/2024 (Originally 04/07/2024)   Pneumococcal Vaccine: 50+ Years (1 of 1 - PCV) 06/13/2025 (Originally 02/25/2016)   Hepatitis B Vaccines 19-59 Average Risk (1 of 3 - 19+ 3-dose series) 06/13/2025 (Originally 02/24/1985)   Hepatitis C Screening  06/13/2025 (Originally 02/25/1984)   COVID-19 Vaccine (3 - Pfizer risk series) 06/29/2025 (Originally 05/04/2020)   DTaP/Tdap/Td (2 - Td or Tdap) 06/29/2026    Colonoscopy  07/20/2027   HPV VACCINES (No Doses Required) Completed   HIV Screening  Completed   Zoster Vaccines- Shingrix  Completed   Meningococcal B Vaccine  Aged Out     ----------------------------------------------------------------------------------------------------------------------------------------------------------------------------------------------------------------- Physical Exam BP 127/79   Pulse 89   Ht 5' 11 (1.803 m)   Wt 218 lb (98.9 kg)   SpO2 99%   BMI 30.40 kg/m   Physical Exam Constitutional:      Appearance: Normal appearance.  Eyes:     General: No scleral icterus. Cardiovascular:     Rate and Rhythm: Normal rate and regular rhythm.  Pulmonary:     Effort: Pulmonary effort is normal.     Breath sounds: Normal breath sounds.  Abdominal:     General: Bowel sounds are normal. There is no distension.     Palpations: Abdomen is soft. There is no mass.     Tenderness: There is no abdominal tenderness. There is no guarding or rebound.  Musculoskeletal:     Cervical back: Neck supple.  Neurological:     Mental Status: He is alert.  Psychiatric:        Mood and Affect: Mood normal.        Behavior: Behavior normal.     ------------------------------------------------------------------------------------------------------------------------------------------------------------------------------------------------------------------- Assessment and Plan  Groin pain No palpable hernias and benign exam.  UA is normal.  Symptoms suspicious for prostatitis.  Checking PSA today.  Adding bactrim  for empiric treatment.  He will let me know if symptoms persist or worsen.  Will proceed with CT abdomen/pelvis if not improving.     Meds ordered this encounter  Medications   sulfamethoxazole -trimethoprim  (BACTRIM  DS) 800-160 MG tablet    Sig: Take 1 tablet by mouth 2 (two) times daily for 14 days.    Dispense:  28 tablet    Refill:  0    No follow-ups on  file.        [1] No Known Allergies  "

## 2024-10-03 NOTE — Assessment & Plan Note (Addendum)
 No palpable hernias and benign exam.  UA is normal.  Symptoms suspicious for prostatitis.  Checking PSA today.  Adding bactrim  for empiric treatment.  He will let me know if symptoms persist or worsen.  Will proceed with CT abdomen/pelvis if not improving.

## 2024-10-03 NOTE — Patient Instructions (Signed)
 Start bactrim  twice daily x14 days.   Let me know if not improving after a few days or having worsening pain.

## 2024-10-04 LAB — CBC WITH DIFFERENTIAL/PLATELET
Basophils Absolute: 0.1 10*3/uL (ref 0.0–0.2)
Basos: 2 %
EOS (ABSOLUTE): 0.4 10*3/uL (ref 0.0–0.4)
Eos: 7 %
Hematocrit: 45.4 % (ref 37.5–51.0)
Hemoglobin: 15 g/dL (ref 13.0–17.7)
Immature Grans (Abs): 0 10*3/uL (ref 0.0–0.1)
Immature Granulocytes: 0 %
Lymphocytes Absolute: 2 10*3/uL (ref 0.7–3.1)
Lymphs: 31 %
MCH: 29.6 pg (ref 26.6–33.0)
MCHC: 33 g/dL (ref 31.5–35.7)
MCV: 90 fL (ref 79–97)
Monocytes Absolute: 0.6 10*3/uL (ref 0.1–0.9)
Monocytes: 9 %
Neutrophils Absolute: 3.3 10*3/uL (ref 1.4–7.0)
Neutrophils: 50 %
Platelets: 284 10*3/uL (ref 150–450)
RBC: 5.06 x10E6/uL (ref 4.14–5.80)
RDW: 12.2 % (ref 11.6–15.4)
WBC: 6.5 10*3/uL (ref 3.4–10.8)

## 2024-10-04 LAB — CMP14+EGFR
ALT: 26 [IU]/L (ref 0–44)
AST: 26 [IU]/L (ref 0–40)
Albumin: 4.7 g/dL (ref 3.8–4.9)
Alkaline Phosphatase: 80 [IU]/L (ref 47–123)
BUN/Creatinine Ratio: 14 (ref 9–20)
BUN: 15 mg/dL (ref 6–24)
Bilirubin Total: 0.6 mg/dL (ref 0.0–1.2)
CO2: 25 mmol/L (ref 20–29)
Calcium: 10.2 mg/dL (ref 8.7–10.2)
Chloride: 99 mmol/L (ref 96–106)
Creatinine, Ser: 1.07 mg/dL (ref 0.76–1.27)
Globulin, Total: 2.4 g/dL (ref 1.5–4.5)
Glucose: 102 mg/dL — ABNORMAL HIGH (ref 70–99)
Potassium: 4.5 mmol/L (ref 3.5–5.2)
Sodium: 139 mmol/L (ref 134–144)
Total Protein: 7.1 g/dL (ref 6.0–8.5)
eGFR: 80 mL/min/{1.73_m2}

## 2024-10-04 LAB — PSA: Prostate Specific Ag, Serum: 2 ng/mL (ref 0.0–4.0)

## 2024-10-06 ENCOUNTER — Ambulatory Visit: Payer: Self-pay | Admitting: Family Medicine

## 2024-10-06 ENCOUNTER — Encounter: Payer: Self-pay | Admitting: Family Medicine
